# Patient Record
Sex: Female | Born: 1960 | Race: Black or African American | Hispanic: No | Marital: Married | Smoking: Never smoker
Health system: Southern US, Community
[De-identification: ages and names within clinical notes are randomized; demographics above are authoritative.]

## PROBLEM LIST (undated history)

## (undated) DIAGNOSIS — J45909 Unspecified asthma, uncomplicated: Secondary | ICD-10-CM

## (undated) DIAGNOSIS — C801 Malignant (primary) neoplasm, unspecified: Secondary | ICD-10-CM

## (undated) DIAGNOSIS — R112 Nausea with vomiting, unspecified: Secondary | ICD-10-CM

## (undated) DIAGNOSIS — E213 Hyperparathyroidism, unspecified: Secondary | ICD-10-CM

## (undated) DIAGNOSIS — D649 Anemia, unspecified: Secondary | ICD-10-CM

## (undated) DIAGNOSIS — Z9889 Other specified postprocedural states: Secondary | ICD-10-CM

## (undated) DIAGNOSIS — D219 Benign neoplasm of connective and other soft tissue, unspecified: Secondary | ICD-10-CM

## (undated) HISTORY — DX: Malignant (primary) neoplasm, unspecified: C80.1

## (undated) HISTORY — PX: BREAST EXCISIONAL BIOPSY: SUR124

---

## 1999-02-21 ENCOUNTER — Encounter: Admission: RE | Admit: 1999-02-21 | Discharge: 1999-05-22 | Payer: Self-pay | Admitting: Obstetrics and Gynecology

## 1999-05-13 ENCOUNTER — Encounter (INDEPENDENT_AMBULATORY_CARE_PROVIDER_SITE_OTHER): Payer: Self-pay | Admitting: Specialist

## 1999-05-13 ENCOUNTER — Inpatient Hospital Stay (HOSPITAL_COMMUNITY): Admission: AD | Admit: 1999-05-13 | Discharge: 1999-05-16 | Payer: Self-pay | Admitting: Gynecology

## 1999-06-17 HISTORY — PX: TUBAL LIGATION: SHX77

## 1999-06-26 ENCOUNTER — Ambulatory Visit (HOSPITAL_COMMUNITY): Admission: RE | Admit: 1999-06-26 | Discharge: 1999-06-26 | Payer: Self-pay | Admitting: Gynecology

## 2001-02-25 ENCOUNTER — Ambulatory Visit (HOSPITAL_COMMUNITY): Admission: RE | Admit: 2001-02-25 | Discharge: 2001-02-25 | Payer: Self-pay | Admitting: Gynecology

## 2001-02-25 ENCOUNTER — Encounter: Payer: Self-pay | Admitting: Gynecology

## 2001-11-10 ENCOUNTER — Emergency Department (HOSPITAL_COMMUNITY): Admission: EM | Admit: 2001-11-10 | Discharge: 2001-11-10 | Payer: Self-pay | Admitting: Emergency Medicine

## 2001-11-10 ENCOUNTER — Encounter: Payer: Self-pay | Admitting: Emergency Medicine

## 2002-03-10 ENCOUNTER — Ambulatory Visit (HOSPITAL_COMMUNITY): Admission: RE | Admit: 2002-03-10 | Discharge: 2002-03-10 | Payer: Self-pay | Admitting: Gynecology

## 2002-03-10 ENCOUNTER — Encounter: Payer: Self-pay | Admitting: Gynecology

## 2002-10-10 ENCOUNTER — Other Ambulatory Visit: Admission: RE | Admit: 2002-10-10 | Discharge: 2002-10-10 | Payer: Self-pay | Admitting: Gynecology

## 2003-03-16 ENCOUNTER — Encounter: Payer: Self-pay | Admitting: Gynecology

## 2003-03-16 ENCOUNTER — Ambulatory Visit (HOSPITAL_COMMUNITY): Admission: RE | Admit: 2003-03-16 | Discharge: 2003-03-16 | Payer: Self-pay | Admitting: Gynecology

## 2003-09-25 ENCOUNTER — Other Ambulatory Visit: Admission: RE | Admit: 2003-09-25 | Discharge: 2003-09-25 | Payer: Self-pay | Admitting: Gynecology

## 2004-03-21 ENCOUNTER — Ambulatory Visit (HOSPITAL_COMMUNITY): Admission: RE | Admit: 2004-03-21 | Discharge: 2004-03-21 | Payer: Self-pay | Admitting: Gynecology

## 2004-09-25 ENCOUNTER — Other Ambulatory Visit: Admission: RE | Admit: 2004-09-25 | Discharge: 2004-09-25 | Payer: Self-pay | Admitting: Gynecology

## 2005-03-27 ENCOUNTER — Ambulatory Visit (HOSPITAL_COMMUNITY): Admission: RE | Admit: 2005-03-27 | Discharge: 2005-03-27 | Payer: Self-pay | Admitting: Gynecology

## 2006-08-19 ENCOUNTER — Emergency Department (HOSPITAL_COMMUNITY): Admission: EM | Admit: 2006-08-19 | Discharge: 2006-08-19 | Payer: Self-pay | Admitting: Emergency Medicine

## 2008-03-22 ENCOUNTER — Other Ambulatory Visit: Admission: RE | Admit: 2008-03-22 | Discharge: 2008-03-22 | Payer: Self-pay | Admitting: Gynecology

## 2008-03-26 ENCOUNTER — Ambulatory Visit (HOSPITAL_COMMUNITY): Admission: RE | Admit: 2008-03-26 | Discharge: 2008-03-26 | Payer: Self-pay | Admitting: Gynecology

## 2008-11-16 HISTORY — PX: OTHER SURGICAL HISTORY: SHX169

## 2009-02-28 ENCOUNTER — Ambulatory Visit: Payer: Self-pay | Admitting: Gynecology

## 2009-04-05 ENCOUNTER — Ambulatory Visit: Payer: Self-pay | Admitting: Gynecology

## 2009-04-05 ENCOUNTER — Other Ambulatory Visit: Admission: RE | Admit: 2009-04-05 | Discharge: 2009-04-05 | Payer: Self-pay | Admitting: Gynecology

## 2009-04-05 ENCOUNTER — Encounter: Payer: Self-pay | Admitting: Gynecology

## 2009-08-15 ENCOUNTER — Ambulatory Visit (HOSPITAL_COMMUNITY): Admission: RE | Admit: 2009-08-15 | Discharge: 2009-08-15 | Payer: Self-pay | Admitting: Gynecology

## 2009-08-19 ENCOUNTER — Encounter: Admission: RE | Admit: 2009-08-19 | Discharge: 2009-08-19 | Payer: Self-pay | Admitting: Gynecology

## 2011-11-17 HISTORY — PX: OTHER SURGICAL HISTORY: SHX169

## 2012-08-02 ENCOUNTER — Other Ambulatory Visit: Payer: Self-pay | Admitting: *Deleted

## 2012-08-02 DIAGNOSIS — N6320 Unspecified lump in the left breast, unspecified quadrant: Secondary | ICD-10-CM

## 2012-08-12 ENCOUNTER — Ambulatory Visit
Admission: RE | Admit: 2012-08-12 | Discharge: 2012-08-12 | Disposition: A | Discharge status: Home / Self Care | Payer: BC Managed Care – PPO | Source: Ambulatory Visit | Attending: Gynecology | Admitting: Gynecology

## 2012-08-12 ENCOUNTER — Other Ambulatory Visit (HOSPITAL_COMMUNITY)
Admission: RE | Admit: 2012-08-12 | Discharge: 2012-08-12 | Disposition: A | Discharge status: Home / Self Care | Payer: BC Managed Care – PPO | Source: Ambulatory Visit | Attending: Gynecology | Admitting: Gynecology

## 2012-08-12 ENCOUNTER — Other Ambulatory Visit: Payer: Self-pay | Admitting: Gynecology

## 2012-08-12 ENCOUNTER — Telehealth: Payer: Self-pay | Admitting: *Deleted

## 2012-08-12 ENCOUNTER — Ambulatory Visit (INDEPENDENT_AMBULATORY_CARE_PROVIDER_SITE_OTHER): Payer: BC Managed Care – PPO | Admitting: Gynecology

## 2012-08-12 ENCOUNTER — Encounter: Payer: Self-pay | Admitting: Gynecology

## 2012-08-12 VITALS — BP 108/78 | Ht 66.5 in | Wt 276.0 lb

## 2012-08-12 DIAGNOSIS — N6320 Unspecified lump in the left breast, unspecified quadrant: Secondary | ICD-10-CM

## 2012-08-12 DIAGNOSIS — N946 Dysmenorrhea, unspecified: Secondary | ICD-10-CM

## 2012-08-12 DIAGNOSIS — Z01419 Encounter for gynecological examination (general) (routine) without abnormal findings: Secondary | ICD-10-CM

## 2012-08-12 DIAGNOSIS — Z13220 Encounter for screening for lipoid disorders: Secondary | ICD-10-CM

## 2012-08-12 DIAGNOSIS — N920 Excessive and frequent menstruation with regular cycle: Secondary | ICD-10-CM

## 2012-08-12 DIAGNOSIS — E079 Disorder of thyroid, unspecified: Secondary | ICD-10-CM

## 2012-08-12 DIAGNOSIS — Z1151 Encounter for screening for human papillomavirus (HPV): Secondary | ICD-10-CM | POA: Insufficient documentation

## 2012-08-12 LAB — COMPREHENSIVE METABOLIC PANEL
ALT: 20 U/L (ref 0–35)
AST: 18 U/L (ref 0–37)
Alkaline Phosphatase: 101 U/L (ref 39–117)
BUN: 11 mg/dL (ref 6–23)
CO2: 26 mEq/L (ref 19–32)

## 2012-08-12 LAB — CBC WITH DIFFERENTIAL/PLATELET
Basophils Absolute: 0 10*3/uL (ref 0.0–0.1)
Basophils Relative: 1 % (ref 0–1)
Eosinophils Absolute: 0.2 10*3/uL (ref 0.0–0.7)
Hemoglobin: 11 g/dL — ABNORMAL LOW (ref 12.0–15.0)
Lymphs Abs: 2.4 10*3/uL (ref 0.7–4.0)
MCHC: 32 g/dL (ref 30.0–36.0)
Monocytes Absolute: 0.5 10*3/uL (ref 0.1–1.0)
Monocytes Relative: 9 % (ref 3–12)
Platelets: 335 10*3/uL (ref 150–400)
RBC: 4.58 MIL/uL (ref 3.87–5.11)
RDW: 14.8 % (ref 11.5–15.5)
WBC: 6 10*3/uL (ref 4.0–10.5)

## 2012-08-12 LAB — TSH: TSH: 1.432 u[IU]/mL (ref 0.350–4.500)

## 2012-08-12 LAB — FOLLICLE STIMULATING HORMONE: FSH: 6.4 m[IU]/mL

## 2012-08-12 NOTE — Progress Notes (Signed)
Tabitha White 08-10-1961 [id number]        51 y.o.  G3P3003 for annual exam.  Has not been in the office in over 3 years. Several issues noted below.  Past medical history,surgical history, medications, allergies, family history and social history were all reviewed and documented in the EPIC chart. ROS:  Was performed and pertinent positives and negatives are included in the history.  Exam: Fleet Contras assistant Filed Vitals:   08/12/12 1050  BP: 108/78  Height: 5' 6.5" (1.689 m)  Weight: 276 lb (125.193 kg)   General appearance  Normal Skin grossly normal Head/Neck 2 cm firm nontender nodule right lobe of thyroid mobile no overlying skin changes no other palpable abnormalities.  No adenopathy Lungs clear Cardiac RR, without RMG Abdominal  soft, nontender, without masses, organomegaly or hernia Breasts  examined lying and sitting without masses, retractions, discharge or axillary adenopathy. Pelvic  Ext/BUS/vagina  normal   Cervix  normal Pap/HPV  Uterus  Grossly normal, exam limited by abdominal girth.  Adnexa  Without gross masses or tenderness    Anus and perineum  normal   Rectovaginal  normal sphincter tone without palpated masses or tenderness.    Assessment/Plan:  51 y.o. G61P3003 female for annual exam, tubal sterilization.   1. Menorrhagia. Patient notes her periods are becoming heavier and more crampy. She had been evaluated for increasing menorrhagia previously in 2009 with ultrasound showing multiple small myomas. She notes now that her periods are lasting 7 days requiring tampon and pad usage with bleed 2 episodes. They are regular monthly without intermenstrual bleeding. She's not having any significant hormonal symptoms such as hot flushes night sweats.  We'll start with CBC TSH and sonohysterogram. Patient will follow up afterwards for discussion of treatment options.  Will also check an FSH to see where she stands from a menopausal standpoint. 2. Thyroid mass.  Patient has a 2 cm firm mass right thyroid region. Difficult to tell whether thyroid versus muscle. Patient has noticed this over the last several weeks. Nontender to her.  Will start with ultrasound. Discussed various possibilities to include cancer and the need for absolute follow up was stressed. Patient knows will call her to schedule the ultrasound at the hospital. Possible referral to endocrinologist or surgeon reviewed.  3. Mammography. Patient has mammogram scheduled today. We'll continue with annual mammography. SBE monthly reviewed. 4. Pap smear. Pap/HPV done today. Over 3 years since her last Pap smear. She does have numerous normal reports in her chart with no history of abnormal Pap smears before. 5. Health maintenance. Baseline CBC lipid profile comprehensive metabolic panel urinalysis ordered with her other blood work.    Dara Lords MD, 11:53 AM 08/12/2012

## 2012-08-12 NOTE — Patient Instructions (Signed)
Follow up for ultrasound at [facility] office and at the hospital as arranged

## 2012-08-12 NOTE — Telephone Encounter (Signed)
Order placed, ultrasound at [facility] 08/16/12 @ 11:30 am left message for pt to call.

## 2012-08-12 NOTE — Telephone Encounter (Signed)
Message copied by Aura Camps on Fri Aug 12, 2012 12:25 PM ------      Message from: Dara Lords      Created: Fri Aug 12, 2012 12:00 PM       Schedule ultrasound of the thyroid at the hospital. Reference right thyroid palpable mass. She also is arranging for a sonohysterogram at this office. I don't want her confused about the 2 different ultrasound studies as she needs to have both done.

## 2012-08-13 LAB — URINALYSIS W MICROSCOPIC + REFLEX CULTURE
Bilirubin Urine: NEGATIVE
Casts: NONE SEEN
Hgb urine dipstick: NEGATIVE
Leukocytes, UA: NEGATIVE
Nitrite: POSITIVE — AB
Specific Gravity, Urine: 1.018 (ref 1.005–1.030)

## 2012-08-14 LAB — URINE CULTURE

## 2012-08-15 ENCOUNTER — Other Ambulatory Visit: Payer: Self-pay | Admitting: Gynecology

## 2012-08-15 NOTE — Telephone Encounter (Signed)
Patient informed. We reviewed dates for both appts and patient is clear on what is scheduled and when.

## 2012-08-16 ENCOUNTER — Ambulatory Visit (HOSPITAL_COMMUNITY)
Admission: RE | Admit: 2012-08-16 | Discharge: 2012-08-16 | Disposition: A | Discharge status: Home / Self Care | Payer: BC Managed Care – PPO | Source: Ambulatory Visit | Attending: Gynecology | Admitting: Gynecology

## 2012-08-16 ENCOUNTER — Telehealth: Payer: Self-pay | Admitting: Gynecology

## 2012-08-16 DIAGNOSIS — E079 Disorder of thyroid, unspecified: Secondary | ICD-10-CM

## 2012-08-16 NOTE — Telephone Encounter (Signed)
Tell patient ultrasound shows solid area in thyroid where we felt the mass.  Needs to be biopsied.  Check to see where we send patient to arrange ie endocrinologist/radiologist/surgeon and refer her to them.

## 2012-08-17 ENCOUNTER — Other Ambulatory Visit: Payer: Self-pay | Admitting: Gynecology

## 2012-08-17 DIAGNOSIS — N920 Excessive and frequent menstruation with regular cycle: Secondary | ICD-10-CM

## 2012-08-17 NOTE — Telephone Encounter (Signed)
Pt informed with the below note. 

## 2012-08-17 NOTE — Telephone Encounter (Signed)
Left message for pt to call. Appointment with Dr. Derrell Lolling 08/22/12 10:10 a.m. At [facility] [place] [facility].

## 2012-08-18 ENCOUNTER — Other Ambulatory Visit: Payer: Self-pay | Admitting: Gynecology

## 2012-08-18 ENCOUNTER — Ambulatory Visit
Admission: RE | Admit: 2012-08-18 | Discharge: 2012-08-18 | Disposition: A | Discharge status: Home / Self Care | Payer: BC Managed Care – PPO | Source: Ambulatory Visit | Attending: Gynecology | Admitting: Gynecology

## 2012-08-18 DIAGNOSIS — N6320 Unspecified lump in the left breast, unspecified quadrant: Secondary | ICD-10-CM

## 2012-08-19 ENCOUNTER — Ambulatory Visit
Admission: RE | Admit: 2012-08-19 | Discharge: 2012-08-19 | Disposition: A | Discharge status: Home / Self Care | Payer: BC Managed Care – PPO | Source: Ambulatory Visit | Attending: Gynecology | Admitting: Gynecology

## 2012-08-19 DIAGNOSIS — N6320 Unspecified lump in the left breast, unspecified quadrant: Secondary | ICD-10-CM

## 2012-08-22 ENCOUNTER — Ambulatory Visit (INDEPENDENT_AMBULATORY_CARE_PROVIDER_SITE_OTHER): Payer: BC Managed Care – PPO | Admitting: General Surgery

## 2012-08-22 ENCOUNTER — Encounter (INDEPENDENT_AMBULATORY_CARE_PROVIDER_SITE_OTHER): Payer: Self-pay | Admitting: General Surgery

## 2012-08-22 VITALS — BP 110/70 | HR 72 | Temp 97.4°F | Resp 18 | Ht 66.0 in | Wt 279.0 lb

## 2012-08-22 DIAGNOSIS — N63 Unspecified lump in unspecified breast: Secondary | ICD-10-CM

## 2012-08-22 DIAGNOSIS — E041 Nontoxic single thyroid nodule: Secondary | ICD-10-CM

## 2012-08-22 DIAGNOSIS — N6320 Unspecified lump in the left breast, unspecified quadrant: Secondary | ICD-10-CM

## 2012-08-22 NOTE — Progress Notes (Signed)
Patient ID: Tabitha White, female   DOB: 1961/03/05, 51 y.o.   MRN: [id number]  Chief Complaint  Patient presents with  . Thyroid Nodule  . Mass    Breast    HPI Tabitha White is a 51 y.o. female who is referred by Dr. Audie Box for a left breast biopsy which revealed benign breast tissue but  Radiologist would recommend excision of the left lower outer breast tissue which is significant on mammogram and was biopsied.   The patient is biopsy in this area approximately 2010 which time the area was just observed.  The patient also noticed approximately 2-3 weeks ago a right thyroid nodule which was ultrasound however was not FNA.  Patient states she has had no dysphagia pain weight loss change is to heat intolerance at this time but does notice a mass in the right side of her thyroid. HPI  Past Medical History  Diagnosis Date  . Diabetes mellitus     Gestational    Past Surgical History  Procedure Date  . Left breast biospy 2010  . Tubal ligation 06/1999    laproscopic    Family History  Problem Relation Age of Onset  . Heart failure Mother   . Hypertension Mother   . Seizures Mother   . Stroke Mother   . Thyroid disease Mother   . Diabetes Father   . Heart failure Father   . Breast cancer Sister 58  . Diabetes Paternal Grandmother   . Hypertension Sister     Social History History  Substance Use Topics  . Smoking status: Never Smoker   . Smokeless tobacco: Not on file  . Alcohol Use: No    No Known Allergies  No current outpatient prescriptions on file.    Review of Systems Review of Systems  Constitutional: Negative.   HENT: Negative.   Eyes: Negative.   Respiratory: Negative.   Cardiovascular: Negative.   Gastrointestinal: Negative.   Hematological: Negative.     Last menstrual period 07/29/2012.  Physical Exam Physical Exam  Constitutional: She is oriented to person, place, and time. She appears well-developed.  HENT:  Head:  Normocephalic and atraumatic.  Eyes: Conjunctivae normal and EOM are normal. Pupils are equal, round, and reactive to light.  Neck: Normal range of motion. Mass (Right side, not fixed) present.  Cardiovascular: Normal rate and regular rhythm.   Pulmonary/Chest: Effort normal and breath sounds normal.    Abdominal: Soft. Bowel sounds are normal.  Musculoskeletal: Normal range of motion.  Neurological: She is oriented to person, place, and time.    Data Reviewed Ultrasound of the breast and thyroid were reviewed as well as the breast pathology.  Assessment    Patient is a 51 year old female with a left breast mass which was biopsied to reveal to have enlarging however benign mass. Patient is also noticed to have a right-sided thyroid mass which has not been FNA at this point, so will proceed with FNA at this time. Once we get definite pathology from her thyroid mass we will proceed with possible left wire localization excision of her left breast mass with possible thyroid lobectomy to pathology reveals concerning results. If not we'll discontinue with left breast excision    Plan    As above       Marigene Ehlers., Jed Limerick 08/22/2012, 2:43 PM

## 2012-08-24 ENCOUNTER — Encounter: Payer: Self-pay | Admitting: Gynecology

## 2012-08-24 ENCOUNTER — Other Ambulatory Visit: Payer: Self-pay | Admitting: Gynecology

## 2012-08-24 ENCOUNTER — Ambulatory Visit (INDEPENDENT_AMBULATORY_CARE_PROVIDER_SITE_OTHER): Payer: BC Managed Care – PPO | Admitting: Gynecology

## 2012-08-24 ENCOUNTER — Ambulatory Visit
Admission: RE | Admit: 2012-08-24 | Discharge: 2012-08-24 | Disposition: A | Discharge status: Home / Self Care | Payer: BC Managed Care – PPO | Source: Ambulatory Visit | Attending: General Surgery | Admitting: General Surgery

## 2012-08-24 ENCOUNTER — Other Ambulatory Visit (HOSPITAL_COMMUNITY)
Admission: RE | Admit: 2012-08-24 | Discharge: 2012-08-24 | Disposition: A | Discharge status: Home / Self Care | Payer: BC Managed Care – PPO | Source: Ambulatory Visit | Attending: Interventional Radiology | Admitting: Interventional Radiology

## 2012-08-24 ENCOUNTER — Ambulatory Visit (INDEPENDENT_AMBULATORY_CARE_PROVIDER_SITE_OTHER): Payer: BC Managed Care – PPO

## 2012-08-24 DIAGNOSIS — N839 Noninflammatory disorder of ovary, fallopian tube and broad ligament, unspecified: Secondary | ICD-10-CM

## 2012-08-24 DIAGNOSIS — D251 Intramural leiomyoma of uterus: Secondary | ICD-10-CM

## 2012-08-24 DIAGNOSIS — D259 Leiomyoma of uterus, unspecified: Secondary | ICD-10-CM

## 2012-08-24 DIAGNOSIS — N852 Hypertrophy of uterus: Secondary | ICD-10-CM

## 2012-08-24 DIAGNOSIS — E079 Disorder of thyroid, unspecified: Secondary | ICD-10-CM

## 2012-08-24 DIAGNOSIS — N920 Excessive and frequent menstruation with regular cycle: Secondary | ICD-10-CM

## 2012-08-24 DIAGNOSIS — E049 Nontoxic goiter, unspecified: Secondary | ICD-10-CM | POA: Insufficient documentation

## 2012-08-24 DIAGNOSIS — N63 Unspecified lump in unspecified breast: Secondary | ICD-10-CM

## 2012-08-24 NOTE — Progress Notes (Signed)
Patient presents for sonohysterogram due to history of menorrhagia/leiomyoma. Her FSH and TSH returned normal, hemoglobin 11.0. Had needle biopsy of her right thyroid mass this morning. Is also in the process of arranging follow up and excisional biopsy of left breast mass. Biopsy results from this was benign but due to enlarging mass they are proceeding with excision. Her calcium level did return elevated at 12. Not sure if this is related to her thyroid mass as in a parathyroid tumor. We'll check PTH and repeat calcium.  Ultrasound shows uterus enlarged at 16 x 11 x 8 cm. Endometrial echo 10.3 mm. Multiple myomas noted largest the 48 mm. Right ovary with small cystic area 24 x 19 x 22 tubular shaped avascular left ovary normal. Cul-de-sac negative. Sonohysterogram performed, sterile technique, easy catheter introduction, good distention with no abnormalities. Endometrial sample taken. Patient tolerated well.  Assessment and plan: 1. Thyroid mass. Elevated calcium. Will repeat calcium and PTH she will follow up with her surgeon in reference to the mass. 2. Breast mass. I be examined her today she does have a nodular 3 cm area 4 to 6:00 off the areola. It feels consistent with firmer breast tissue. No other masses discharge axillary adenopathy. Patient will proceed with excisional biopsy as they planned. 3. Menorrhagia/leiomyoma.  Ultrasound with multiple myomas. Patient will follow up for biopsy results. Small cystic area which I thinks physiologic on the right ovary possible small proximal hydro-from her tubal. Regardless its avascular simple and small and will plan no specific follow up. Reviewed options for menorrhagia in 51 year old female. Hormonal manipulation, Mirena IUD, endometrial ablation, hysterectomy reviewed. Given her other issues include breast and thyroid will wait at present and readdress this after she is done with her other surgeries. Patient comfortable with this plan and will follow up  after the other surgeries are over.

## 2012-08-24 NOTE — Patient Instructions (Signed)
Office will call you with blood test results and uterine biopsy results.

## 2012-08-25 LAB — PTH, INTACT AND CALCIUM
Calcium, Total (PTH): 12.3 mg/dL — ABNORMAL HIGH (ref 8.4–10.5)
PTH: 228.7 pg/mL — ABNORMAL HIGH (ref 14.0–72.0)

## 2012-08-26 ENCOUNTER — Telehealth (INDEPENDENT_AMBULATORY_CARE_PROVIDER_SITE_OTHER): Payer: Self-pay

## 2012-08-26 NOTE — Telephone Encounter (Signed)
Olegario Messier called to let us know to make sure Dr Derrell Lolling sees the PTH and calcium level.  The patient will be calling our office regarding the results because Dr Derrell Lolling saw the pt for a thyroid nodule.

## 2012-08-31 ENCOUNTER — Other Ambulatory Visit (INDEPENDENT_AMBULATORY_CARE_PROVIDER_SITE_OTHER): Payer: Self-pay | Admitting: General Surgery

## 2012-08-31 DIAGNOSIS — D351 Benign neoplasm of parathyroid gland: Secondary | ICD-10-CM

## 2012-08-31 DIAGNOSIS — E039 Hypothyroidism, unspecified: Secondary | ICD-10-CM

## 2012-09-16 ENCOUNTER — Encounter (HOSPITAL_COMMUNITY)
Admission: RE | Admit: 2012-09-16 | Discharge: 2012-09-16 | Disposition: A | Discharge status: Home / Self Care | Payer: BC Managed Care – PPO | Source: Ambulatory Visit | Attending: General Surgery | Admitting: General Surgery

## 2012-09-16 DIAGNOSIS — D351 Benign neoplasm of parathyroid gland: Secondary | ICD-10-CM

## 2012-09-16 DIAGNOSIS — E213 Hyperparathyroidism, unspecified: Secondary | ICD-10-CM | POA: Insufficient documentation

## 2012-09-16 DIAGNOSIS — R799 Abnormal finding of blood chemistry, unspecified: Secondary | ICD-10-CM | POA: Insufficient documentation

## 2012-09-16 MED ORDER — TECHNETIUM TC 99M SESTAMIBI - CARDIOLITE
24.2000 | Freq: Once | INTRAVENOUS | Status: AC | PRN
  Administered 2012-09-16: 24.2 via INTRAVENOUS

## 2012-09-21 ENCOUNTER — Encounter (INDEPENDENT_AMBULATORY_CARE_PROVIDER_SITE_OTHER): Payer: Self-pay | Admitting: General Surgery

## 2012-09-21 ENCOUNTER — Ambulatory Visit (INDEPENDENT_AMBULATORY_CARE_PROVIDER_SITE_OTHER): Payer: BC Managed Care – PPO | Admitting: General Surgery

## 2012-09-21 VITALS — BP 134/68 | HR 96 | Temp 97.2°F | Resp 20 | Ht 66.5 in | Wt 282.6 lb

## 2012-09-21 DIAGNOSIS — E041 Nontoxic single thyroid nodule: Secondary | ICD-10-CM

## 2012-09-21 NOTE — Progress Notes (Signed)
Patient ID: Tabitha White, female   DOB: 09/10/1961, 51 y.o.   MRN: [id number]  Chief Complaint  Patient presents with  . Results    go over thy test    HPI Tabitha White is a 51 y.o. female.  The patient is a 51-year-old female has been seen in my clinic to see for a benign left breast tissue as well as a thyroid mass. Patient was worked up and found to have primary hyperparathyroidism. Patient had a sestamibi scan which localized to the right lower lobe. Patient had an elevated calcium of 12.3 as well as elevated PTH of 228. Patient states she's had no signs of kidney disease or bony aches at this time however does say she has some variabilityand mood swings   HPI  Past Medical History  Diagnosis Date  . Diabetes mellitus     Gestational    Past Surgical History  Procedure Date  . Left breast biospy 2010  . Tubal ligation 06/1999    laproscopic    Family History  Problem Relation Age of Onset  . Heart failure Mother   . Hypertension Mother   . Seizures Mother   . Stroke Mother   . Thyroid disease Mother   . Diabetes Father   . Heart failure Father   . Breast cancer Sister 54  . Diabetes Paternal Grandmother   . Hypertension Sister     Social History History  Substance Use Topics  . Smoking status: Never Smoker   . Smokeless tobacco: Not on file  . Alcohol Use: No    No Known Allergies  No current outpatient prescriptions on file.    Review of Systems Review of Systems  Constitutional: Negative.   HENT: Negative.   Eyes: Negative.   Respiratory: Negative.   Cardiovascular: Negative.   Gastrointestinal: Negative.   Musculoskeletal: Negative.   Neurological: Negative.     Blood pressure 134/68, pulse 96, temperature 97.2 F (36.2 C), temperature source Temporal, resp. rate 20, height 5' 6.5" (1.689 m), weight 282 lb 9.6 oz (128.187 kg), last menstrual period 08/26/2012.  Physical Exam Physical Exam  Constitutional: She appears  well-developed and well-nourished.  HENT:  Head: Normocephalic and atraumatic.  Eyes: Conjunctivae normal are normal. Pupils are equal, round, and reactive to light.  Neck: Mass (right) present.         Palpable mass  Cardiovascular: Normal rate and regular rhythm.   Pulmonary/Chest: Effort normal and breath sounds normal.  Abdominal: Soft. Bowel sounds are normal.    Data Reviewed Patient PTH of 228. Calcium 12.3. Sestamibi scan localizing parathyroid adenoma the right inferior lobe.  Assessment    1. Patient is 51-year-old female with primary hyperparathyroidism secondary to parathyroid adenoma right inferior location.    Plan    1. We'll proceed operating room for excision of right parathyroid adenoma.  2. After removal of the parathyroid will revisit the excision of her benign breast tissue in her left breast.  2.All risks and benefits were discussed with the patient, to generally include infection, bleeding, damage to surrounding structures. Alternatives were offered and described.  All questions were answered and the patient voiced understanding of the procedure and wishes to proceed at this point.        Montrelle Eddings Jr., Bodhi Stenglein 09/21/2012, 4:10 PM    

## 2012-10-10 ENCOUNTER — Encounter (HOSPITAL_COMMUNITY): Payer: Self-pay | Admitting: Pharmacy Technician

## 2012-10-10 ENCOUNTER — Encounter (HOSPITAL_COMMUNITY): Payer: Self-pay

## 2012-10-10 ENCOUNTER — Encounter (HOSPITAL_COMMUNITY)
Admission: RE | Admit: 2012-10-10 | Discharge: 2012-10-10 | Disposition: A | Discharge status: Home / Self Care | Payer: BC Managed Care – PPO | Source: Ambulatory Visit | Attending: General Surgery | Admitting: General Surgery

## 2012-10-10 DIAGNOSIS — E049 Nontoxic goiter, unspecified: Secondary | ICD-10-CM | POA: Diagnosis not present

## 2012-10-10 DIAGNOSIS — C750 Malignant neoplasm of parathyroid gland: Secondary | ICD-10-CM | POA: Diagnosis not present

## 2012-10-10 DIAGNOSIS — E210 Primary hyperparathyroidism: Secondary | ICD-10-CM | POA: Diagnosis present

## 2012-10-10 DIAGNOSIS — Z79899 Other long term (current) drug therapy: Secondary | ICD-10-CM | POA: Diagnosis not present

## 2012-10-10 DIAGNOSIS — Z01812 Encounter for preprocedural laboratory examination: Secondary | ICD-10-CM | POA: Diagnosis not present

## 2012-10-10 HISTORY — DX: Hyperparathyroidism, unspecified: E21.3

## 2012-10-10 HISTORY — DX: Anemia, unspecified: D64.9

## 2012-10-10 HISTORY — DX: Unspecified asthma, uncomplicated: J45.909

## 2012-10-10 LAB — BASIC METABOLIC PANEL
BUN: 8 mg/dL (ref 6–23)
CO2: 26 mEq/L (ref 19–32)
Chloride: 107 mEq/L (ref 96–112)
GFR calc Af Amer: >90 mL/min (ref >90)
Potassium: 4.7 mEq/L (ref 3.5–5.1)

## 2012-10-10 LAB — HCG, SERUM, QUALITATIVE: Preg, Serum: NEGATIVE

## 2012-10-10 LAB — CBC
Hemoglobin: 10.3 g/dL — ABNORMAL LOW (ref 12.0–15.0)
RBC: 4.43 MIL/uL (ref 3.87–5.11)

## 2012-10-10 LAB — SURGICAL PCR SCREEN
MRSA, PCR: NEGATIVE
Staphylococcus aureus: NEGATIVE

## 2012-10-10 NOTE — Pre-Procedure Instructions (Signed)
20 TAIMI TOWE  10/10/2012   Your procedure is scheduled on:  10/18/2012  Report to [facility] at 5:30 AM.  Call this number if you have problems the morning of surgery: [phone]   Remember:   Do not eat food or drink liquid :After Midnight.- MONDAY  :   Take these medicines the morning of surgery with A SIP OF WATER: NOTHING   Do not wear jewelry, make-up or nail polish.  Do not wear lotions, powders, or perfumes. You may wear deodorant.  Do not shave 48 hours prior to surgery.   Do not bring valuables to the hospital.  Contacts, dentures or bridgework may not be worn into surgery.  Leave suitcase in the car. After surgery it may be brought to your room.  For patients admitted to the hospital, checkout time is 11:00 AM the day of discharge.   Patients discharged the day of surgery will not be allowed to drive home.  Name and phone number of your driver: /w Spouse  Special Instructions: Shower using CHG 2 nights before surgery and the night before surgery.  If you shower the day of surgery use CHG.  Use special wash - you have one bottle of CHG for all showers.  You should use approximately 1/3 of the bottle for each shower.   Please read over the following fact sheets that you were given: Pain Booklet, Coughing and Deep Breathing, MRSA Information and Surgical Site Infection Prevention

## 2012-10-17 MED ORDER — DEXTROSE 5 % IV SOLN
3.0000 g | INTRAVENOUS | Status: AC
  Administered 2012-10-18: 3 g via INTRAVENOUS
  Filled 2012-10-17: qty 3000

## 2012-10-18 ENCOUNTER — Encounter (HOSPITAL_COMMUNITY)
Admission: RE | Disposition: A | Discharge status: Home / Self Care | Payer: Self-pay | Source: Ambulatory Visit | Attending: General Surgery

## 2012-10-18 ENCOUNTER — Encounter (HOSPITAL_COMMUNITY): Payer: Self-pay | Admitting: Surgery

## 2012-10-18 ENCOUNTER — Observation Stay (HOSPITAL_COMMUNITY)
Admission: RE | Admit: 2012-10-18 | Discharge: 2012-10-19 | DRG: 290 | Disposition: A | Discharge status: Home / Self Care | Payer: BC Managed Care – PPO | Source: Ambulatory Visit | Attending: General Surgery | Admitting: General Surgery

## 2012-10-18 ENCOUNTER — Encounter (HOSPITAL_COMMUNITY): Payer: Self-pay | Admitting: Anesthesiology

## 2012-10-18 ENCOUNTER — Ambulatory Visit (HOSPITAL_COMMUNITY): Payer: BC Managed Care – PPO | Admitting: Anesthesiology

## 2012-10-18 DIAGNOSIS — E210 Primary hyperparathyroidism: Secondary | ICD-10-CM | POA: Insufficient documentation

## 2012-10-18 DIAGNOSIS — E049 Nontoxic goiter, unspecified: Secondary | ICD-10-CM | POA: Insufficient documentation

## 2012-10-18 DIAGNOSIS — C750 Malignant neoplasm of parathyroid gland: Principal | ICD-10-CM | POA: Insufficient documentation

## 2012-10-18 DIAGNOSIS — E041 Nontoxic single thyroid nodule: Secondary | ICD-10-CM

## 2012-10-18 DIAGNOSIS — Z01812 Encounter for preprocedural laboratory examination: Secondary | ICD-10-CM | POA: Insufficient documentation

## 2012-10-18 DIAGNOSIS — Z79899 Other long term (current) drug therapy: Secondary | ICD-10-CM | POA: Insufficient documentation

## 2012-10-18 HISTORY — PX: THYROID LOBECTOMY: SHX420

## 2012-10-18 LAB — GLUCOSE, CAPILLARY: Glucose-Capillary: 128 mg/dL — ABNORMAL HIGH (ref 70–99)

## 2012-10-18 LAB — BASIC METABOLIC PANEL
CO2: 26 mEq/L (ref 19–32)
Chloride: 104 mEq/L (ref 96–112)
Glucose, Bld: 131 mg/dL — ABNORMAL HIGH (ref 70–99)
Potassium: 4.9 mEq/L (ref 3.5–5.1)
Sodium: 134 mEq/L — ABNORMAL LOW (ref 135–145)

## 2012-10-18 SURGERY — LOBECTOMY, THYROID
Anesthesia: General | Site: Neck | Laterality: Right | Wound class: Clean

## 2012-10-18 MED ORDER — HEMOSTATIC AGENTS (NO CHARGE) OPTIME
TOPICAL | Status: DC | PRN
  Administered 2012-10-18: 1 via TOPICAL

## 2012-10-18 MED ORDER — ONDANSETRON HCL 4 MG/2ML IJ SOLN
INTRAMUSCULAR | Status: DC | PRN
  Administered 2012-10-18: 4 mg via INTRAVENOUS

## 2012-10-18 MED ORDER — HYDROCODONE-ACETAMINOPHEN 5-325 MG PO TABS
1.0000 | ORAL_TABLET | ORAL | Status: DC | PRN
  Administered 2012-10-18: 2 via ORAL
  Administered 2012-10-18: 1 via ORAL
  Administered 2012-10-19: 2 via ORAL
  Filled 2012-10-18: qty 1
  Filled 2012-10-18 (×2): qty 2

## 2012-10-18 MED ORDER — HYDROMORPHONE HCL PF 1 MG/ML IJ SOLN
0.2500 mg | INTRAMUSCULAR | Status: DC | PRN
  Administered 2012-10-18: 0.5 mg via INTRAVENOUS

## 2012-10-18 MED ORDER — CHLORHEXIDINE GLUCONATE 4 % EX LIQD: 1.0000 "application " | Freq: Once | CUTANEOUS | Status: DC

## 2012-10-18 MED ORDER — ROCURONIUM BROMIDE 100 MG/10ML IV SOLN
INTRAVENOUS | Status: DC | PRN
  Administered 2012-10-18: 50 mg via INTRAVENOUS
  Administered 2012-10-18 (×2): 10 mg via INTRAVENOUS

## 2012-10-18 MED ORDER — BUPIVACAINE HCL (PF) 0.25 % IJ SOLN
INTRAMUSCULAR | Status: AC
  Filled 2012-10-18: qty 30

## 2012-10-18 MED ORDER — LACTATED RINGERS IV SOLN
INTRAVENOUS | Status: DC | PRN
  Administered 2012-10-18 (×2): via INTRAVENOUS

## 2012-10-18 MED ORDER — GLYCOPYRROLATE 0.2 MG/ML IJ SOLN
INTRAMUSCULAR | Status: DC | PRN
  Administered 2012-10-18: 0.6 mg via INTRAVENOUS

## 2012-10-18 MED ORDER — BUPIVACAINE HCL (PF) 0.25 % IJ SOLN
INTRAMUSCULAR | Status: DC | PRN
  Administered 2012-10-18: 10 mL

## 2012-10-18 MED ORDER — ARTIFICIAL TEARS OP OINT
TOPICAL_OINTMENT | OPHTHALMIC | Status: DC | PRN
  Administered 2012-10-18: 1 via OPHTHALMIC

## 2012-10-18 MED ORDER — OXYCODONE HCL 5 MG PO TABS: 5.0000 mg | ORAL_TABLET | Freq: Once | ORAL | Status: DC | PRN

## 2012-10-18 MED ORDER — ONDANSETRON HCL 4 MG/2ML IJ SOLN: 4.0000 mg | Freq: Once | INTRAMUSCULAR | Status: DC | PRN

## 2012-10-18 MED ORDER — ONDANSETRON HCL 4 MG/2ML IJ SOLN: 4.0000 mg | Freq: Four times a day (QID) | INTRAMUSCULAR | Status: DC | PRN

## 2012-10-18 MED ORDER — HYDROMORPHONE HCL PF 1 MG/ML IJ SOLN
INTRAMUSCULAR | Status: AC
  Filled 2012-10-18: qty 1

## 2012-10-18 MED ORDER — DEXTROSE IN LACTATED RINGERS 5 % IV SOLN
INTRAVENOUS | Status: DC
  Administered 2012-10-18: 14:00:00 via INTRAVENOUS
  Administered 2012-10-18: 50 mL/h via INTRAVENOUS

## 2012-10-18 MED ORDER — OXYCODONE HCL 5 MG/5ML PO SOLN: 5.0000 mg | Freq: Once | ORAL | Status: DC | PRN

## 2012-10-18 MED ORDER — MEPERIDINE HCL 25 MG/ML IJ SOLN: 6.2500 mg | INTRAMUSCULAR | Status: DC | PRN

## 2012-10-18 MED ORDER — FENTANYL CITRATE 0.05 MG/ML IJ SOLN
INTRAMUSCULAR | Status: DC | PRN
  Administered 2012-10-18 (×4): 50 ug via INTRAVENOUS
  Administered 2012-10-18: 100 ug via INTRAVENOUS
  Administered 2012-10-18: 50 ug via INTRAVENOUS

## 2012-10-18 MED ORDER — LIDOCAINE HCL (CARDIAC) 20 MG/ML IV SOLN
INTRAVENOUS | Status: DC | PRN
  Administered 2012-10-18: 100 mg via INTRAVENOUS

## 2012-10-18 MED ORDER — PROPOFOL 10 MG/ML IV BOLUS
INTRAVENOUS | Status: DC | PRN
  Administered 2012-10-18: 200 mg via INTRAVENOUS

## 2012-10-18 MED ORDER — NEOSTIGMINE METHYLSULFATE 1 MG/ML IJ SOLN
INTRAMUSCULAR | Status: DC | PRN
  Administered 2012-10-18: 4 mg via INTRAVENOUS

## 2012-10-18 MED ORDER — HYDROMORPHONE HCL PF 1 MG/ML IJ SOLN: 1.0000 mg | INTRAMUSCULAR | Status: DC | PRN

## 2012-10-18 MED ORDER — FLEET ENEMA 7-19 GM/118ML RE ENEM: 1.0000 | ENEMA | Freq: Once | RECTAL | Status: DC

## 2012-10-18 MED ORDER — MIDAZOLAM HCL 5 MG/5ML IJ SOLN
INTRAMUSCULAR | Status: DC | PRN
  Administered 2012-10-18 (×2): 1 mg via INTRAVENOUS

## 2012-10-18 MED ORDER — 0.9 % SODIUM CHLORIDE (POUR BTL) OPTIME
TOPICAL | Status: DC | PRN
  Administered 2012-10-18: 1000 mL

## 2012-10-18 SURGICAL SUPPLY — 58 items
ADH SKN CLS APL DERMABOND .7 (GAUZE/BANDAGES/DRESSINGS) ×2
APL SKNCLS STERI-STRIP NONHPOA (GAUZE/BANDAGES/DRESSINGS) ×2
BENZOIN TINCTURE PRP APPL 2/3 (GAUZE/BANDAGES/DRESSINGS) ×3 IMPLANT
BLADE SURG 15 STRL LF DISP TIS (BLADE) ×2 IMPLANT
BLADE SURG 15 STRL SS (BLADE) ×3
CANISTER SUCTION 2500CC (MISCELLANEOUS) ×3 IMPLANT
CHLORAPREP W/TINT 10.5 ML (MISCELLANEOUS) ×3 IMPLANT
CLIP TI MEDIUM 6 (CLIP) ×5 IMPLANT
CLIP TI WIDE RED SMALL 6 (CLIP) ×5 IMPLANT
CLOTH BEACON ORANGE TIMEOUT ST (SAFETY) ×3 IMPLANT
CONT SPEC 4OZ CLIKSEAL STRL BL (MISCELLANEOUS) ×3 IMPLANT
COVER SURGICAL LIGHT HANDLE (MISCELLANEOUS) ×3 IMPLANT
DERMABOND ADVANCED (GAUZE/BANDAGES/DRESSINGS) ×1
DERMABOND ADVANCED .7 DNX12 (GAUZE/BANDAGES/DRESSINGS) ×1 IMPLANT
DRAPE PED LAPAROTOMY (DRAPES) ×3 IMPLANT
DRAPE UTILITY 15X26 W/TAPE STR (DRAPE) ×6 IMPLANT
ELECT CAUTERY BLADE 6.4 (BLADE) ×3 IMPLANT
ELECT COATED BLADE 2.86 ST (ELECTRODE) ×3 IMPLANT
ELECT REM PT RETURN 9FT ADLT (ELECTROSURGICAL) ×3
ELECTRODE REM PT RTRN 9FT ADLT (ELECTROSURGICAL) ×2 IMPLANT
GAUZE SPONGE 2X2 8PLY STRL LF (GAUZE/BANDAGES/DRESSINGS) ×2 IMPLANT
GAUZE SPONGE 4X4 16PLY XRAY LF (GAUZE/BANDAGES/DRESSINGS) ×7 IMPLANT
GLOVE BIO SURGEON STRL SZ 6.5 (GLOVE) ×2 IMPLANT
GLOVE BIO SURGEON STRL SZ7.5 (GLOVE) ×3 IMPLANT
GLOVE BIOGEL PI IND STRL 7.0 (GLOVE) ×4 IMPLANT
GLOVE BIOGEL PI IND STRL 8 (GLOVE) ×2 IMPLANT
GLOVE BIOGEL PI INDICATOR 7.0 (GLOVE) ×4
GLOVE BIOGEL PI INDICATOR 8 (GLOVE) ×1
GLOVE EUDERMIC 7 POWDERFREE (GLOVE) ×2 IMPLANT
GLOVE EXAM NITRILE MD LF STRL (GLOVE) ×2 IMPLANT
GLOVE SURG SS PI 7.0 STRL IVOR (GLOVE) ×6 IMPLANT
GOWN BRE IMP SLV AUR XL STRL (GOWN DISPOSABLE) ×2 IMPLANT
GOWN STRL NON-REIN LRG LVL3 (GOWN DISPOSABLE) ×10 IMPLANT
HEMOSTAT SURGICEL 2X4 FIBR (HEMOSTASIS) ×3 IMPLANT
KIT BASIN OR (CUSTOM PROCEDURE TRAY) ×3 IMPLANT
KIT ROOM TURNOVER OR (KITS) ×3 IMPLANT
NDL HYPO 25GX1X1/2 BEV (NEEDLE) IMPLANT
NEEDLE HYPO 25GX1X1/2 BEV (NEEDLE) ×3 IMPLANT
NS IRRIG 1000ML POUR BTL (IV SOLUTION) ×3 IMPLANT
PACK SURGICAL SETUP 50X90 (CUSTOM PROCEDURE TRAY) ×3 IMPLANT
PAD ARMBOARD 7.5X6 YLW CONV (MISCELLANEOUS) ×6 IMPLANT
PENCIL BUTTON HOLSTER BLD 10FT (ELECTRODE) ×3 IMPLANT
SHEARS HARMONIC 9CM CVD (BLADE) ×2 IMPLANT
SPONGE GAUZE 2X2 STER 10/PKG (GAUZE/BANDAGES/DRESSINGS) ×1
SPONGE INTESTINAL PEANUT (DISPOSABLE) ×4 IMPLANT
STRIP CLOSURE SKIN 1/2X4 (GAUZE/BANDAGES/DRESSINGS) ×3 IMPLANT
SUT MNCRL AB 4-0 PS2 18 (SUTURE) ×3 IMPLANT
SUT SILK 2 0 (SUTURE)
SUT SILK 2 0 SH (SUTURE) ×2 IMPLANT
SUT SILK 2-0 18XBRD TIE 12 (SUTURE) IMPLANT
SUT SILK 3 0 (SUTURE)
SUT SILK 3-0 18XBRD TIE 12 (SUTURE) IMPLANT
SUT VIC AB 3-0 SH 18 (SUTURE) ×5 IMPLANT
SYR BULB 3OZ (MISCELLANEOUS) ×2 IMPLANT
SYR CONTROL 10ML LL (SYRINGE) ×3 IMPLANT
TOWEL OR 17X24 6PK STRL BLUE (TOWEL DISPOSABLE) ×3 IMPLANT
TOWEL OR 17X26 10 PK STRL BLUE (TOWEL DISPOSABLE) ×3 IMPLANT
TUBE CONNECTING 12X1/4 (SUCTIONS) ×3 IMPLANT

## 2012-10-18 NOTE — Progress Notes (Signed)
Pt reports doing fleets enema at home preoperatively per Dr. Derrell Lolling' staff instruction.  Pt only had gestational diabetes & denies any problems w/blood sugar.

## 2012-10-18 NOTE — Anesthesia Preprocedure Evaluation (Addendum)
Anesthesia Evaluation  Patient identified by MRN, date of birth, ID band Patient awake    Reviewed: Allergy & Precautions, H&P , NPO status , Patient's Chart, lab work & pertinent test results  Airway Mallampati: II TM Distance: >3 FB Neck ROM: Full    Dental  (+) Teeth Intact   Pulmonary asthma ,          Cardiovascular Rhythm:regular     Neuro/Psych    GI/Hepatic   Endo/Other  diabetes, GestationalMorbid obesity  Renal/GU      Musculoskeletal   Abdominal   Peds  Hematology   Anesthesia Other Findings   Reproductive/Obstetrics                         Anesthesia Physical Anesthesia Plan  ASA: II  Anesthesia Plan: General ETT   Post-op Pain Management:    Induction:   Airway Management Planned: Oral ETT  Additional Equipment:   Intra-op Plan:   Post-operative Plan: Extubation in OR  Informed Consent: I have reviewed the patients History and Physical, chart, labs and discussed the procedure including the risks, benefits and alternatives for the proposed anesthesia with the patient or authorized representative who has indicated his/her understanding and acceptance.   Dental Advisory Given  Plan Discussed with: CRNA and Surgeon  Anesthesia Plan Comments:       Anesthesia Quick Evaluation

## 2012-10-18 NOTE — Progress Notes (Signed)
Report given to kay rn as caregiver 

## 2012-10-18 NOTE — Transfer of Care (Signed)
Immediate Anesthesia Transfer of Care Note  Patient: Tabitha White  Procedure(s) Performed: Procedure(s) (LRB) with comments: THYROID LOBECTOMY (Right) - Right thyroid lobectomy with right parathyroid biopsy  Patient Location: PACU  Anesthesia Type:General  Level of Consciousness: awake  Airway & Oxygen Therapy: Patient Spontanous Breathing and Patient connected to face mask oxygen  Post-op Assessment: Report given to PACU RN and Post -op Vital signs reviewed and stable  Post vital signs: Reviewed and stable  Complications: No apparent anesthesia complications

## 2012-10-18 NOTE — Preoperative (Signed)
Beta Blockers   Reason not to administer Beta Blockers:Not Applicable 

## 2012-10-18 NOTE — Interval H&P Note (Signed)
History and Physical Interval Note:  10/18/2012 7:15 AM  Tabitha White  has presented today for surgery, with the diagnosis of hyperparathyroidism  The various methods of treatment have been discussed with the patient and family. After consideration of risks, benefits and other options for treatment, the patient has consented to  Procedure(s) (LRB) with comments: PARATHYROIDECTOMY (N/A) - Excision right inferior parathyroid(Parathyroidectomy) as a surgical intervention .  The patient's history has been reviewed, patient examined, no change in status, stable for surgery.  I have reviewed the patient's chart and labs.  Questions were answered to the patient's satisfaction.     Marigene Ehlers., Jed Limerick

## 2012-10-18 NOTE — H&P (View-Only) (Signed)
Patient ID: Tabitha White, female   DOB: 1961-11-02, 51 y.o.   MRN: [id number]  Chief Complaint  Patient presents with  . Results    go over thy test    HPI Tabitha White is a 51 y.o. female.  The patient is a 51 year old female has been seen in my clinic to see for a benign left breast tissue as well as a thyroid mass. Patient was worked up and found to have primary hyperparathyroidism. Patient had a sestamibi scan which localized to the right lower lobe. Patient had an elevated calcium of 12.3 as well as elevated PTH of 228. Patient states she's had no signs of kidney disease or bony aches at this time however does say she has some variabilityand mood swings   HPI  Past Medical History  Diagnosis Date  . Diabetes mellitus     Gestational    Past Surgical History  Procedure Date  . Left breast biospy 2010  . Tubal ligation 06/1999    laproscopic    Family History  Problem Relation Age of Onset  . Heart failure Mother   . Hypertension Mother   . Seizures Mother   . Stroke Mother   . Thyroid disease Mother   . Diabetes Father   . Heart failure Father   . Breast cancer Sister 35  . Diabetes Paternal Grandmother   . Hypertension Sister     Social History History  Substance Use Topics  . Smoking status: Never Smoker   . Smokeless tobacco: Not on file  . Alcohol Use: No    No Known Allergies  No current outpatient prescriptions on file.    Review of Systems Review of Systems  Constitutional: Negative.   HENT: Negative.   Eyes: Negative.   Respiratory: Negative.   Cardiovascular: Negative.   Gastrointestinal: Negative.   Musculoskeletal: Negative.   Neurological: Negative.     Blood pressure 134/68, pulse 96, temperature 97.2 F (36.2 C), temperature source Temporal, resp. rate 20, height 5' 6.5" (1.689 m), weight 282 lb 9.6 oz (128.187 kg), last menstrual period 08/26/2012.  Physical Exam Physical Exam  Constitutional: She appears  well-developed and well-nourished.  HENT:  Head: Normocephalic and atraumatic.  Eyes: Conjunctivae normal are normal. Pupils are equal, round, and reactive to light.  Neck: Mass (right) present.         Palpable mass  Cardiovascular: Normal rate and regular rhythm.   Pulmonary/Chest: Effort normal and breath sounds normal.  Abdominal: Soft. Bowel sounds are normal.    Data Reviewed Patient PTH of 228. Calcium 12.3. Sestamibi scan localizing parathyroid adenoma the right inferior lobe.  Assessment    1. Patient is 51 year old female with primary hyperparathyroidism secondary to parathyroid adenoma right inferior location.    Plan    1. We'll proceed operating room for excision of right parathyroid adenoma.  2. After removal of the parathyroid will revisit the excision of her benign breast tissue in her left breast.  2.All risks and benefits were discussed with the patient, to generally include infection, bleeding, damage to surrounding structures. Alternatives were offered and described.  All questions were answered and the patient voiced understanding of the procedure and wishes to proceed at this point.        Tabitha White., Tabitha White 09/21/2012, 4:10 PM

## 2012-10-18 NOTE — Anesthesia Postprocedure Evaluation (Signed)
Anesthesia Post Note  Patient: Tabitha White  Procedure(s) Performed: Procedure(s) (LRB): THYROID LOBECTOMY (Right)  Anesthesia type: general  Patient location: PACU  Post pain: Pain level controlled  Post assessment: Patient's Cardiovascular Status Stable  Last Vitals:  Filed Vitals:   10/18/12 1115  BP: 125/63  Pulse: 94  Temp: 36.9 C  Resp: 23    Post vital signs: Reviewed and stable  Level of consciousness: sedated  Complications: No apparent anesthesia complications

## 2012-10-18 NOTE — Op Note (Signed)
Pre Operative Diagnosis:  Primary hyperparathyroidism  Post Operative Diagnosis: primary hyperparathyroidism, and right cystic lobe of thyroid  Procedure: right thyroid lobectomy, superior parathyroid biopsy  Surgeon: Dr. Axel Filler  Assistant: Dr. Derrell Lolling  Anesthesia: GETA  EBL: 20 cc  Complications: none  Counts: reported as correct x 2  Findings:  The patient is a cystic right thyroid lobe. The superior parathyroid gland was identified for biopsy and as per pathology. The inferior parathyroid adenoma was not found in the usual anatomical location, it could not be palpated in the tracheoesophageal groove , or carotid sheath.  Indications for procedure:  The patient is a 51 year old female who was initially worked up for a right thyroid mass. This was biopsied which does show thyroiditis. Patient ultimately had elevated calcium and PTH. Patient localizes sestamibi scan to the right inferior parathyroid adenoma. Secondary to the localization proceeded to attempt to remove the right inferior parathyroid for primary parathyroidism  Details of the procedure:The patient was taken back to the operating room. The patient was placed in supine position with bilateral SCDs in place. After appropriate anitbiotics were confirmed, a time-out was confirmed and all facts were verified.  a collar incision was made approximately 4 cm, this was approximately 2 fingerbreadths above the sternal notch.  Bovie Cautery  Was used to maintain hemostasis and dissection is carried down through the platysma. Platysmal flaps were raised superiorly and inferiorly to the thyroid notch as well as a sternal notch.  The strap muscles were divided in the midline. We then proceeded to raise her right lobe of the thyroid into the incision site.  Initially mobilize the right inferior lobe in hopes of identifying the right inferior parathyroid adenoma. However secondary to cystic nature and mass of the right thyroid lobe. We  continued to mobilize the superior thyroid lobe. The superior thyroid vessels were doubly ligated. The superior parathyroid gland was identified and biopsies taken and sent for frozen pathology and confirmed.  This allowed [place] to continue mobilize the inferior lobe.  The inferior vessels were doubly ligated. Upon bringing the lobe into the midline were not able to identify the inferior parathyroid adenoma.  Unable to palpate the tracheoesophageal groove as well as the carotid sheath were unable to palpate a nodular adenoma.  At this time secondary to the fact of not being able to find the inferior parathyroid adenoma sent to abandon the search for it.  Pathology attempted to dissect the thyroid gland were unable to identify any parathyroid adenoma.  After the extent of our dissection dissecting the hemostasis was excellent despite the case. Fibrillar was used to the strap muscles and they treated for hemostasis. The strap muscles were then realigned to the midline using interrupted 3-0 Vicryl stitches. The platysma was then reapproximated using 3-0 Vicryl stitches in interrupted fashion. The skin was reapproximated using 4 Monocryl in a subcuticular fashion. The skin was then dressed with Dermabond.  The patient was taken to the recovery room in stable condition.

## 2012-10-19 ENCOUNTER — Encounter (HOSPITAL_COMMUNITY): Payer: Self-pay | Admitting: General Surgery

## 2012-10-19 ENCOUNTER — Other Ambulatory Visit (INDEPENDENT_AMBULATORY_CARE_PROVIDER_SITE_OTHER): Payer: Self-pay | Admitting: General Surgery

## 2012-10-19 DIAGNOSIS — C750 Malignant neoplasm of parathyroid gland: Secondary | ICD-10-CM | POA: Diagnosis not present

## 2012-10-19 DIAGNOSIS — D351 Benign neoplasm of parathyroid gland: Secondary | ICD-10-CM

## 2012-10-19 LAB — BASIC METABOLIC PANEL
CO2: 25 mEq/L (ref 19–32)
Chloride: 104 mEq/L (ref 96–112)
GFR calc Af Amer: 70 mL/min — ABNORMAL LOW (ref >90)
Sodium: 136 mEq/L (ref 135–145)

## 2012-10-19 LAB — CALCIUM, IONIZED: Calcium, Ion: 1.46 mmol/L — ABNORMAL HIGH (ref 1.12–1.32)

## 2012-10-19 MED ORDER — OXYCODONE-ACETAMINOPHEN 10-325 MG PO TABS: 1.0000 | ORAL_TABLET | ORAL | Status: DC | PRN

## 2012-10-19 NOTE — Progress Notes (Signed)
Patient discharged to home.  Discharge teaching complete including follow up care, medications, signs and symptoms of infection.  Verbalizes understanding with no further questions.  Discharged per wheelchair with family.

## 2012-10-19 NOTE — Discharge Summary (Signed)
Physician Discharge Summary  Patient ID: Tabitha White MRN: [id number] DOB/AGE: 1961/10/30 51 y.o.  Admit date: 10/18/2012 Discharge date: 10/19/2012  Admission Diagnoses: Primary hypoparathyroidism  Discharge Diagnoses:  S/p R thyroidectomy Active Problems:  * No active hospital problems. *    Discharged Condition: good  Hospital Course: Pt was sent to the floor post op.  Pt was started on a CLD and adv to a reg diet which she was able to tol well.  She had good pain control.  Ca con't to decrease from labs in the hospital.  [facility] for DC home  Consults: None  Significant Diagnostic Studies: labs: Ca:10  Treatments: surgery: R thyroidectomy  Discharge Exam: Blood pressure 111/57, pulse 79, temperature 97.9 F (36.6 C), temperature source Oral, resp. rate 18, height 5\' 6"  (1.676 m), weight 280 lb (127.007 kg), last menstrual period 09/26/2012, SpO2 95.00%. Neck: wound is c/d/i, minimal edema  Disposition:   Discharge Orders    Future Appointments: Provider: Department: Dept Phone: Center:   11/04/2012 4:00 PM Axel Filler, MD [facility], [place] [phone] None       Medication List     As of 10/19/2012  7:28 AM    ASK your doctor about these medications         acetaminophen 500 MG tablet   Commonly known as: TYLENOL   Take 1,000 mg by mouth every 6 (six) hours as needed. For pain/fever           Follow-up Information    Follow up with Lajean Saver, MD. Schedule an appointment as soon as possible for a visit in 1 week.   Contact information:   [address] [place] [address] [phone]          Signed: Marigene Ehlers., Jed Limerick 10/19/2012, 7:28 AM

## 2012-10-21 ENCOUNTER — Telehealth (INDEPENDENT_AMBULATORY_CARE_PROVIDER_SITE_OTHER): Payer: Self-pay | Admitting: General Surgery

## 2012-10-21 NOTE — Telephone Encounter (Signed)
Pt called with question about swelling/ puffiness at incision.  She states she can feel fluid when she touches the area.  Denies pain, fever, redness or other signs of infection.  No problems with swallowing.  Reassured pt and okayed her use of ice pack to site as needed.  She understands.

## 2012-10-25 ENCOUNTER — Encounter: Payer: Self-pay | Admitting: *Deleted

## 2012-10-25 NOTE — Progress Notes (Signed)
Referral from Dr. Derrell Lolling with [facility] to see Dr. Truett Perna for new DX: parathyroid carcinoma. Patient has not been give diagnosis yet. Sees surgeon on 10/27/12. Per Dr. Truett Perna, he will see her on 12/26 at 3:15 pm (1 hr) or 12/24 1:30 pm (1 hr) as New Patient. DO NOT CALL PATIENT UNTIL AFTER 10/31/12. Forwarded to [employer] in [employer]

## 2012-10-26 ENCOUNTER — Telehealth: Payer: Self-pay | Admitting: Oncology

## 2012-10-26 LAB — PTH, INTACT AND CALCIUM
Calcium, Total (PTH): 7.5 mg/dL — ABNORMAL LOW (ref 8.4–10.5)
PTH: 139.6 pg/mL — ABNORMAL HIGH (ref 14.0–72.0)

## 2012-10-26 LAB — CALCIUM: Calcium: 7.4 mg/dL — ABNORMAL LOW (ref 8.4–10.5)

## 2012-10-26 NOTE — Telephone Encounter (Signed)
LVOM for pt to return call.  °

## 2012-10-27 ENCOUNTER — Ambulatory Visit (INDEPENDENT_AMBULATORY_CARE_PROVIDER_SITE_OTHER): Payer: BC Managed Care – PPO | Admitting: General Surgery

## 2012-10-27 ENCOUNTER — Other Ambulatory Visit (INDEPENDENT_AMBULATORY_CARE_PROVIDER_SITE_OTHER): Payer: Self-pay | Admitting: General Surgery

## 2012-10-27 ENCOUNTER — Encounter (INDEPENDENT_AMBULATORY_CARE_PROVIDER_SITE_OTHER): Payer: Self-pay | Admitting: General Surgery

## 2012-10-27 ENCOUNTER — Telehealth: Payer: Self-pay | Admitting: Oncology

## 2012-10-27 VITALS — BP 128/70 | HR 72 | Temp 97.8°F | Resp 16 | Ht 66.5 in | Wt 286.0 lb

## 2012-10-27 DIAGNOSIS — Z9889 Other specified postprocedural states: Secondary | ICD-10-CM

## 2012-10-27 DIAGNOSIS — C750 Malignant neoplasm of parathyroid gland: Secondary | ICD-10-CM

## 2012-10-27 MED ORDER — CALCIUM CARBONATE-VITAMIN D 500-200 MG-UNIT PO TABS: 2.0000 | ORAL_TABLET | Freq: Three times a day (TID) | ORAL | Status: DC

## 2012-10-27 NOTE — Progress Notes (Signed)
Patient ID: Tabitha White, female   DOB: 1961-04-14, 51 y.o.   MRN: [id number] The patient is a 51 year old female status post right-sided thyroidectomy for hyperparathyroidism. In the operating room were unable to localize the parathyroid gland therefore secondary to a large cystic-appearing right thyroid gland this was removed.  The final pathology patient was seen to have a parathyroid carcinoma. Patient's laboratory studies postoperatively revealed a normal calcium as well as decreasing PTH level. Patient has been doing well postoperatively swollen sentinel points of bone pain or changes in her temperament.  On exam: Wounds clean dry and intact minimal edema to the area  Assessment and plan:  1. I discussed with the patient in regards to her pathology and rarity of the pathology. We discussed the need to have patient follow endocrinologist and hematology oncologist. Possible the patient may benefit from radiation therapy as well as the neckissection. If the patient does need a neck dissection I would prefer followup with an ENT physician. I spoke with Dr. Myrle Sheng and Dr. Sharl Ma in regard to pathology and they both agreed to evaluate the patient.  2. The patient will followup in one month.  3. The patient will be prescribed calcium supplementation at this time.

## 2012-10-27 NOTE — Telephone Encounter (Signed)
C/D 10/27/12 for appt.11/08/12

## 2012-10-28 ENCOUNTER — Telehealth (INDEPENDENT_AMBULATORY_CARE_PROVIDER_SITE_OTHER): Payer: Self-pay | Admitting: General Surgery

## 2012-10-28 NOTE — Telephone Encounter (Signed)
Called patient and told her that Dr Sharl Ma office will call her next about a apt . I faxed all notes to Dr Sharl Ma office

## 2012-11-04 ENCOUNTER — Encounter (INDEPENDENT_AMBULATORY_CARE_PROVIDER_SITE_OTHER): Payer: BC Managed Care – PPO | Admitting: General Surgery

## 2012-11-08 ENCOUNTER — Ambulatory Visit (HOSPITAL_COMMUNITY)
Admission: RE | Admit: 2012-11-08 | Discharge: 2012-11-08 | Disposition: A | Discharge status: Home / Self Care | Payer: BC Managed Care – PPO | Source: Ambulatory Visit | Attending: Oncology | Admitting: Oncology

## 2012-11-08 ENCOUNTER — Encounter: Payer: Self-pay | Admitting: Oncology

## 2012-11-08 ENCOUNTER — Telehealth: Payer: Self-pay | Admitting: Oncology

## 2012-11-08 ENCOUNTER — Ambulatory Visit: Payer: BC Managed Care – PPO

## 2012-11-08 ENCOUNTER — Ambulatory Visit (HOSPITAL_BASED_OUTPATIENT_CLINIC_OR_DEPARTMENT_OTHER): Payer: BC Managed Care – PPO | Admitting: Oncology

## 2012-11-08 VITALS — BP 129/81 | HR 67 | Temp 97.5°F | Resp 18 | Ht 66.5 in | Wt 282.3 lb

## 2012-11-08 DIAGNOSIS — C750 Malignant neoplasm of parathyroid gland: Secondary | ICD-10-CM | POA: Insufficient documentation

## 2012-11-08 DIAGNOSIS — D509 Iron deficiency anemia, unspecified: Secondary | ICD-10-CM

## 2012-11-08 DIAGNOSIS — N63 Unspecified lump in unspecified breast: Secondary | ICD-10-CM

## 2012-11-08 NOTE — Progress Notes (Signed)
Checked in new pt with no financial concerns. °

## 2012-11-08 NOTE — Telephone Encounter (Signed)
gv and printed appt schedule for pt for March and April 2014...Marland KitchenMarland Kitchenlvm for radiation to schedule appt...the patient going to radiology today.Marland KitchenMarland Kitchen

## 2012-11-08 NOTE — Progress Notes (Signed)
[facility] New Patient Consult   Referring MD: Brylan Seubert 51 y.o.  [birth year]    Reason for Referral: Parathyroid carcinoma     HPI: She noted a mass in the right neck and saw Dr. Audie Box. A thyroid ultrasound on 08/16/2012 confirmed a dominant 3.8 cm right thyroid nodule that was solid. She underwent an FNA biopsy of the right thyroid on 08/24/2012. The cytology revealed evidence of thyroiditis. She was noted to have an elevated calcium level and parathyroid hormone level. A parathyroid scan revealed a persistent focus of increased activity in the inferior right lobe of thyroid consistent with a parathyroid adenoma.  She also has a breast lesion that needs excision. A decision was made to proceed with excision of the left breast mass at a later date.  She was taken to the operating room on 10/18/2012 and underwent a right thyroid lobectomy and superior parathyroid biopsy. An inferior parathyroid adenoma was not found. The pathology revealed a superior right parathyroid gland with no evidence of malignancy. The right thyroid lobectomy contained a 5 cm parathyroid carcinoma, low site of nuclear grade with multiple foci of vascular invasion. Tumor was noted to be adherent to the did not invade the adjacent thyroid parenchyma. The margins were free of carcinoma with a tumor embolus at less than 01 point centimeters from the margin. The case was sent for outside consultation at the [facility] and a diagnosis of parathyroid carcinoma was confirmed.  The calcium level is lower following surgery. She saw Dr. Sharl Ma and has been placed on calcium and vitamin D. No specific complaint today.   Past Medical History  Diagnosis Date  .  parathyroid carcinoma   10/18/2012   . Asthma     as a child, but never used inhaler   . Anemia     per pt. report  . Diabetes mellitus     Gestational, 2000   .  Hypercalcemia and elevated parathyroid  hormone level                                  December 2013  .  Left breast mass, 6:00 position-no atypia or malignancy identified on a core biopsy 08/18/2012  .  G3 P3, she continues to have regular monthly menses  Past Surgical History  Procedure Date  . Left breast biospy 2010  . Tubal ligation 06/1999    laproscopic      . Thyroid lobectomy 10/18/2012    Procedure: THYROID LOBECTOMY;  Surgeon: Axel Filler, MD;  Location: [facility] OR;  Service: General;  Laterality: Right;  Right thyroid lobectomy with right parathyroid biopsy    Family History  Problem Relation Age of Onset  . Heart failure Mother   . Hypertension Mother   . Seizures Mother   . Stroke Mother   . Thyroid disease Mother   . Diabetes Father   . Heart failure Father   . Breast cancer Sister 4  . Diabetes Paternal Grandmother   . Hypertension Sister    .   Breast cancer  maternal aunt  .   "Stomach cancer "                                                                      maternal uncle  Current outpatient prescriptions:calcium-vitamin D (OSCAL 500/200 D-3) 500-200 MG-UNIT per tablet, Take 2 tablets by mouth 3 (three) times daily., Disp: 180 tablet, Rfl: 3;  oxyCODONE-acetaminophen (PERCOCET) 10-325 MG per tablet, Take 10 mg by mouth as needed., Disp: , Rfl: ;  Vitamin D, Ergocalciferol, (DRISDOL) 50000 UNITS CAPS, Take 1.25 mg by mouth Daily., Disp: , Rfl:   Allergies: No Known Allergies  Social History: She works an Paramedic occupation. She lives with her husband and [place]. She does not use tobacco or alcohol. No transfusion history. No risk factor for HIV or hepatitis.   ROS:   Positives include: Palpable mass in the right neck beginning in June of 2013  A complete ROS was otherwise negative.  Physical Exam:  Blood pressure 129/81, pulse 67, temperature 97.5 F (36.4 C), temperature source Oral, resp. rate 18, height 5' 6.5"  (1.689 m), weight 282 lb 4.8 oz (128.05 kg), last menstrual period 10/22/2012.  HEENT: Oral cavity without visible mass, cannot visualize the pharynx, neck without mass, healing incision at the low neck Lungs: Clear bilaterally Cardiac: Regular rate and rhythm Abdomen: No hepatosplenomegaly, nontender, no mas  Vascular: Trace edema at the low pretibial area bilaterally Lymph nodes: No cervical, supraclavicular, axillary, or inguinal nodes Neurologic: Alert and oriented, the motor appears intact in the upper and lower extremities. The deep tendon reflexes are 2+ and symmetric at the biceps and 1+ at the knees Skin: Multiple benign appearing moles over the trunk Musculoskeletal: No spine tenderness   LAB:  CBC  Lab Results  Component Value Date   WBC 6.5 10/10/2012   HGB 10.3* 10/10/2012   HCT 33.7* 10/10/2012   MCV 76.1* 10/10/2012   PLT 282 10/10/2012     CMP      Component Value Date/Time   NA 136 10/19/2012 0550   K 3.9 10/19/2012 0550   CL 104 10/19/2012 0550   CO2 25 10/19/2012 0550   GLUCOSE 109* 10/19/2012 0550   BUN 14 10/19/2012 0550   CREATININE 1.06 10/19/2012 0550   CREATININE 0.70 08/12/2012 1144   CALCIUM 7.4* 10/25/2012 1315   CALCIUM 7.5* 10/19/2012 0844   PROT 6.9 08/12/2012 1144   ALBUMIN 4.2 08/12/2012 1144   AST 18 08/12/2012 1144   ALT 20 08/12/2012 1144   ALKPHOS 101 08/12/2012 1144   BILITOT 0.4 08/12/2012 1144   GFRNONAA 60* 10/19/2012 0550   GFRAA 70* 10/19/2012 0550    Assessment/Plan:   1. Parathyroid carcinoma, status post a right thyroidectomy on 10/18/2012 confirming a 5 cm parathyroid carcinoma with multiple foci of vascular invasion  2. History of hypercalcemia secondary to #1-resolved following surgical resection of the parathyroid carcinoma  3. Microcytic anemia,? Iron deficiency related to menses  4. Left breast mass-status post a core biopsy 08/18/2012 with no evidence of malignancy, scheduled to undergo an excisional biopsy by Dr.  Derrell Lolling   Disposition:   She has been diagnosed with parathyroid carcinoma. This is an uncommon malignancy and there is no clear indication for additional staging x-rays or adjuvant  therapy. She has been placed on calcium and will be followed by Dr.Kerr. I will refer her to radiation oncology to consider the indication for adjuvant radiation. There is no role for systemic chemotherapy in this setting.  There is no family history to suggest a familial cancer syndrome. However she will be referred to the genetics counselor as some patients with sporadic parathyroid carcinoma have a germline HRPT2 mutation.  She will be referred for a staging chest x-ray. Ms. Bedoya will return for an office visit in 4 months.  The microcytic anemia is most likely related to menstrual blood loss and iron deficiency. We will ask Dr. Audie Box whether this has been a chronic finding. If not, an evaluation for a source of blood loss may be indicated.  Tabitha White 11/08/2012, 5:01 PM

## 2012-11-10 ENCOUNTER — Telehealth: Payer: Self-pay

## 2012-11-10 ENCOUNTER — Encounter (INDEPENDENT_AMBULATORY_CARE_PROVIDER_SITE_OTHER): Payer: Self-pay

## 2012-11-10 ENCOUNTER — Telehealth: Payer: Self-pay | Admitting: Oncology

## 2012-11-10 NOTE — Telephone Encounter (Signed)
Left a message in work vm stating CXR was negative as noted below.

## 2012-11-10 NOTE — Telephone Encounter (Signed)
Message copied by Lorine Bears on Thu Nov 10, 2012  2:56 PM ------      Message from: Wandalee Ferdinand      Created: Thu Nov 10, 2012  2:33 PM                   ----- Message -----         From: Ladene Artist, MD         Sent: 11/09/2012   8:51 AM           To: Wandalee Ferdinand, RN, Glori Luis, RN, #            Please call patient, cxr is negative

## 2012-11-10 NOTE — Telephone Encounter (Signed)
Mrs. Tabitha White returned msg and scheduled pt for 1.9.14 @ 10:15am with Dr. Michell Heinrich...the patient is aware

## 2012-11-15 ENCOUNTER — Ambulatory Visit (HOSPITAL_COMMUNITY): Payer: BC Managed Care – PPO

## 2012-11-15 ENCOUNTER — Telehealth: Payer: Self-pay | Admitting: *Deleted

## 2012-11-15 NOTE — Telephone Encounter (Signed)
New patient consult per Dr. Truett Perna on 11/08/12. Requests to determine from Dr. Audie Box: Is her microcytic anemia chronic and does she have a history of iron deficiency in the past?

## 2012-11-23 ENCOUNTER — Encounter: Payer: Self-pay | Admitting: Radiation Oncology

## 2012-11-23 DIAGNOSIS — C750 Malignant neoplasm of parathyroid gland: Secondary | ICD-10-CM

## 2012-11-23 NOTE — Progress Notes (Signed)
52 year old female. Married.   Palpated mass in right neck beginning June of 2013. Diagnosed with Parathyroid carcinoma status post a right thyroidectomy on 10/18/12 with multiple foci of vascular invasion. Referred by Dr. Alcide Evener for consideration of adjuvant radiation. Dr. Myrle Sheng reports there is no role for systemic chemotherapy in this setting.   Also, benign left breast mass excision delayed.   NKDA No hx of radiation therapy No indication of a pacemaker

## 2012-11-24 ENCOUNTER — Encounter: Payer: Self-pay | Admitting: Radiation Oncology

## 2012-11-24 ENCOUNTER — Telehealth: Payer: Self-pay | Admitting: Radiation Oncology

## 2012-11-24 ENCOUNTER — Ambulatory Visit
Admission: RE | Admit: 2012-11-24 | Discharge: 2012-11-24 | Disposition: A | Discharge status: Home / Self Care | Payer: BC Managed Care – PPO | Source: Ambulatory Visit | Attending: Radiation Oncology | Admitting: Radiation Oncology

## 2012-11-24 VITALS — BP 150/92 | HR 71 | Temp 98.1°F | Ht 66.5 in | Wt 281.0 lb

## 2012-11-24 DIAGNOSIS — C750 Malignant neoplasm of parathyroid gland: Secondary | ICD-10-CM | POA: Insufficient documentation

## 2012-11-24 NOTE — Progress Notes (Signed)
[place] Assesment today

## 2012-11-24 NOTE — Telephone Encounter (Signed)
Met w patient to discuss RO billing. Pt had some financial concerns and advised she will call if she needs assistance.  Dx:  Parathyroid carcinoma - Primary 194.1   Attending Rad:  SW   Rad Tx: Pt has elected no rad tx at this time.

## 2012-11-24 NOTE — Progress Notes (Signed)
Consultation today for Parathyroid Cancer.  Denies any pain or difficulty swallowing nor change in appetite. Requesting review of pathology report.

## 2012-11-24 NOTE — Progress Notes (Signed)
Radiation Oncology         [phone] ________________________________  Initial outpatient Consultation  Name: Tabitha White MRN: [id number]  Date: 11/24/2012  DOB: [birth year]  REFERRING PHYSICIAN: Ladene Artist, MD  DIAGNOSIS: The encounter diagnosis was Parathyroid carcinoma.  HISTORY OF PRESENT ILLNESS::Tabitha White is a 52 y.o. female  palpated a right neck mass in June of this year. She eventually was diagnosed with a parathyroid carcinoma. She had elevated PTH and calcium levels. She underwent a right thyroidectomy on 10/18/2012. A 5 cm parathyroid carcinoma was discovered. This was of low side and nuclear grade with multiple foci of vascular invasion. Tumor was adherent but did not invade into the adjacent thyroid parenchyma. Margins were close. In discussion with pathology the suspicion is that the anterior and posterior margins are less than a millimeter. There was tumor embolus NA vascular space less than 0.1 cm and margin. The pathology was reviewed at the [facility] and diagnosis was confirmed. In speaking with her surgeon he states the thyroid was removed easily. He did not have to scrape anything off of the thyroid cartilage or from the skin. The patient was seen by medical oncology who did not feel there was a role for chemotherapy. She has been referred to genetics counseling. He has no side effects or swallowing difficulties after surgery. She has no pain. Her only surgery related complaint is some swelling along the incision of her neck. Incidentally she has also recently been worked up for a left breast mass. 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 This is concordant with the imaging findings however as the mass was increasing significantly in size surgical excision has been recommended. This has not been pursued at this time secondary to her parathyroid carcinoma. She has followup  scheduled with Dr. Derrell Lolling to discuss this.  PREVIOUS RADIATION THERAPY: No  PAST MEDICAL HISTORY:  has a past medical history of Hyperparathyroidism; Asthma; Anemia; Diabetes mellitus; and Cancer.    PAST SURGICAL HISTORY: Past Surgical History  Procedure Date  . Left breast biospy 2010    benign  . Tubal ligation 06/1999    laproscopic  . Thyroid lobectomy 10/18/2012    Procedure: THYROID LOBECTOMY;  Surgeon: Axel Filler, MD;  Location: [facility] OR;  Service: General;  Laterality: Right;  Right thyroid lobectomy with right parathyroid biopsy  . Left breast biopsy 2013    Benign    FAMILY HISTORY: family history includes Breast cancer in her maternal aunt; Breast cancer (age of onset:54) in her sister; Diabetes in her father and paternal grandmother; Heart failure in her father and mother; Hypertension in her mother and sister; Prostate cancer in her maternal uncle; Seizures in her mother; Stomach cancer in her maternal uncle; Stroke in her mother; and Thyroid disease in her mother.  SOCIAL HISTORY:  reports that she has never smoked. She has never used smokeless tobacco. She reports that she does not drink alcohol or use illicit drugs.  ALLERGIES: Review of patient's allergies indicates no known allergies.  MEDICATIONS:  Current Outpatient Prescriptions  Medication Sig Dispense Refill  . calcium-vitamin D (OSCAL 500/200 D-3) 500-200 MG-UNIT per tablet Take 2 tablets by mouth 3 (three) times daily.  180 tablet  3  . oxyCODONE-acetaminophen (PERCOCET) 10-325 MG per tablet Take 10 mg by mouth as needed.      . Vitamin D, Ergocalciferol, (DRISDOL) 50000 UNITS CAPS Take 1.25 mg by mouth Daily.  REVIEW OF SYSTEMS:  A 15 point review of systems is documented in the electronic medical record. This was obtained by the nursing staff. However, I reviewed this with the patient to discuss relevant findings and make appropriate changes.  Pertinent items are noted in HPI.   PHYSICAL EXAM:   height is 5' 6.5" (1.689 m) and weight is 281 lb (127.461 kg). Her temperature is 98.1 F (36.7 C). Her blood pressure is 150/92 and her pulse is 71.   Marland Kitchen He is a pleasant obese female in no distress sitting comfortable on examining table.  LABORATORY DATA:  Lab Results  Component Value Date   WBC 6.5 10/10/2012   HGB 10.3* 10/10/2012   HCT 33.7* 10/10/2012   MCV 76.1* 10/10/2012   PLT 282 10/10/2012   Lab Results  Component Value Date   NA 136 10/19/2012   K 3.9 10/19/2012   CL 104 10/19/2012   CO2 25 10/19/2012   Lab Results  Component Value Date   ALT 20 08/12/2012   AST 18 08/12/2012   ALKPHOS 101 08/12/2012   BILITOT 0.4 08/12/2012     RADIOGRAPHY: Dg Chest 2 View  11/08/2012  *RADIOLOGY REPORT*  Clinical Data: Para thyroid carcinoma  CHEST - 2 VIEW  Comparison: None  Findings: The cardiomediastinal silhouette is unremarkable.  No acute infiltrate or pleural effusion.  No pulmonary edema.  Minimal degenerative changes mid thoracic spine.  IMPRESSION: No active disease.   Original Report Authenticated By: Natasha Mead, M.D.       IMPRESSION: 52 year old female status post right thyroidectomy for a locally advanced parathyroid cancer  PLAN: I spoke to the patient today regarding her diagnosis and options for treatment. She has close margins but no gross residual disease per her surgeon. Review of the medical literature indicates very few cases and certainly no randomized trials to evaluate the role of radiation in improving local control. The largest series from [facility] including 27 patients over 20 years did show an improvement in disease-free survival in patients who undergo radiation. Of 6 patients who received adjuvant radiotherapy only one had local relapse. At the 20 patients who did not receive adjuvant radiotherapy 10 had local relapse. The 10 year survival is 7710%. All tests for hypercalcemia related. 18 patients in the serious however had locally invasive disease. I  discussed the results of this case report with her. We discussed the absence of randomized data. I informed her there was a suggestive benefit of radiation in this series. I discussed with her the process of simulation the making a mask and daily treatments for approximately 6 weeks. We discussed the side effects of treatment including sore throat dysphasia skin discoloration which can be temporary or permanent. I also scheduled her to meet with our financial counselors she was worried about the financial ramifications of treatment. In the end she declined to proceed with treatment. She felt as though there wasn't enough evidence to say that she needed to have treatment and she preferred to just let things proceed onward. I let her know that I had e-mailed a colleague who had likely more experience with this and if he felt strongly that she would benefit from radiation I would call her back. She seemed very comfortable with her decision and we discussed in detail. She has a followup with surgery scheduled soon. I did tell her that if she was unfortunately found to have breast cancer and needed radiation for that I would radiate the parathyroid bed as well  as the breast since she will be coming in for treatment anyway. She was amenable to this. He also has her appointment with genetic counseling scheduled as well. I spent 40 minutes  face to face with the patient and more than 50% of that time was spent in counseling and/or coordination of care.   ------------------------------------------------  Lurline Hare, MD

## 2012-11-28 NOTE — Addendum Note (Signed)
Encounter addended by: Agnes Lawrence, RN on: 11/28/2012 11:04 AM<BR>     Documentation filed: Charges VN

## 2012-11-29 ENCOUNTER — Encounter (INDEPENDENT_AMBULATORY_CARE_PROVIDER_SITE_OTHER): Payer: Self-pay | Admitting: General Surgery

## 2012-11-29 ENCOUNTER — Encounter (INDEPENDENT_AMBULATORY_CARE_PROVIDER_SITE_OTHER): Payer: BC Managed Care – PPO | Admitting: General Surgery

## 2012-11-29 ENCOUNTER — Ambulatory Visit (INDEPENDENT_AMBULATORY_CARE_PROVIDER_SITE_OTHER): Payer: BC Managed Care – PPO | Admitting: General Surgery

## 2012-11-29 VITALS — BP 136/84 | HR 105 | Temp 97.4°F | Resp 18 | Ht 66.0 in | Wt 284.0 lb

## 2012-11-29 DIAGNOSIS — C750 Malignant neoplasm of parathyroid gland: Secondary | ICD-10-CM

## 2012-11-29 NOTE — Progress Notes (Signed)
Patient ID: Tabitha White, female   DOB: 1961-09-02, 52 y.o.   MRN: [id number] The patient is a 52 year old female status post right hemithyroidectomy. 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 Patient has met with Dr. Sharl Ma, Dr. Myrle Sheng, and Dr. Michell Heinrich in consultation. The patient is at this point not to proceed with radiation therapy, would like to see how things go on their own. Patient will continue to follow Dr. Sharl Ma for calcium checks parathyroid hormone checks. The patient is to follow up with genetics as per Dr. Truett Perna.  The patient was really referred for a breast mass that was biopsied and felt to be benign. The patient elected to proceed with excision of this mass in the spring. I Field this isappropriate since a benign mass, and the patient like her parathyroid carcinoma to be settled primarily.  We'll have patient follow back up in 2 months for set up for breast mass excision.

## 2012-12-07 ENCOUNTER — Other Ambulatory Visit: Payer: Self-pay | Admitting: Internal Medicine

## 2012-12-07 DIAGNOSIS — C750 Malignant neoplasm of parathyroid gland: Secondary | ICD-10-CM

## 2012-12-08 ENCOUNTER — Ambulatory Visit
Admission: RE | Admit: 2012-12-08 | Discharge: 2012-12-08 | Disposition: A | Discharge status: Home / Self Care | Payer: BC Managed Care – PPO | Source: Ambulatory Visit | Attending: Internal Medicine | Admitting: Internal Medicine

## 2012-12-08 DIAGNOSIS — C750 Malignant neoplasm of parathyroid gland: Secondary | ICD-10-CM

## 2012-12-19 ENCOUNTER — Other Ambulatory Visit: Payer: Self-pay | Admitting: Internal Medicine

## 2012-12-19 DIAGNOSIS — C750 Malignant neoplasm of parathyroid gland: Secondary | ICD-10-CM

## 2012-12-29 ENCOUNTER — Encounter (HOSPITAL_COMMUNITY): Payer: BC Managed Care – PPO

## 2013-01-16 ENCOUNTER — Ambulatory Visit (HOSPITAL_BASED_OUTPATIENT_CLINIC_OR_DEPARTMENT_OTHER): Payer: BC Managed Care – PPO | Admitting: Genetic Counselor

## 2013-01-16 DIAGNOSIS — C750 Malignant neoplasm of parathyroid gland: Secondary | ICD-10-CM

## 2013-01-17 ENCOUNTER — Encounter: Payer: Self-pay | Admitting: Genetic Counselor

## 2013-01-17 NOTE — Progress Notes (Signed)
Dr. Thornton Papas requested a consultation for genetic counseling and risk assessment for Tabitha White, a 52 y.o. female, for discussion of her personal history of parathyroid carcinoma. [name] presents to clinic today to discuss the possibility of a genetic predisposition to cancer, and to further clarify her risks, as well as her family members' risks for cancer.   HISTORY OF PRESENT ILLNESS: In 2013, at the age of 56, Tabitha White was diagnosed with parathyroid carcinoma. This was treated with partial thyroidectomy and removal of the affected parathyroid.    Past Medical History  Diagnosis Date  . Hyperparathyroidism   . Asthma     as a child, but never used inhaler   . Anemia     per pt. report  . Diabetes mellitus     Gestational, 2000  . Cancer     parathyroid carcinoma    Past Surgical History  Procedure Laterality Date  . Left breast biospy  2010    benign  . Tubal ligation  06/1999    laproscopic  . Thyroid lobectomy  10/18/2012    Procedure: THYROID LOBECTOMY;  Surgeon: Axel Filler, MD;  Location: [facility] OR;  Service: General;  Laterality: Right;  Right thyroid lobectomy with right parathyroid biopsy  . Left breast biopsy  2013    Benign    History  Substance Use Topics  . Smoking status: Never Smoker   . Smokeless tobacco: Never Used  . Alcohol Use: No    REPRODUCTIVE HISTORY AND PERSONAL RISK ASSESSMENT FACTORS: Menarche was at age 9.   Premenopausal Uterus Intact: Yes Ovaries Intact: Yes G3P3A0 , first live birth at age 89  She has not previously undergone treatment for infertility.   OCP use for 20-29 years   She has not used HRT in the past.    FAMILY HISTORY:  We obtained a detailed, 4-generation family history.  Significant diagnoses are listed below: Family History  Problem Relation Age of Onset  . Heart failure Mother   . Hypertension Mother   . Seizures Mother   . Stroke Mother   . Thyroid disease Mother   . Diabetes Father    . Heart failure Father   . Breast cancer Sister 40  . Diabetes Paternal Grandmother   . Hypertension Sister   . Breast cancer Maternal Aunt   . Stomach cancer Maternal Uncle     Great uncle  . Prostate cancer Maternal Uncle   The patient was recently diagnosed with a parathyroid carcinoma.  Her sister was diagnosed with breast cancer at age 34.  The patient's mother died of a stroke at age 51.  She has seven maternal half siblings and four paternal half siblings.  One maternal half brother had prostate cancer.  The patient's maternal grandmother had several siblings, one who had breast cancer in her 41s, one with stomach cancer in his 71s and another with prostate cancer in his 100s. There is no other reported cancer history on either side of the family.  Patient's maternal ancestors are of [place] American and native [place] descent, and paternal ancestors are of African [place] descent. There is no reported Ashkenazi Jewish ancestry. There is no  known consanguinity.  GENETIC COUNSELING RISK ASSESSMENT, DISCUSSION, AND SUGGESTED FOLLOW UP: We reviewed the natural history and genetic etiology of sporadic, familial and hereditary cancer syndromes.  She does not meet medical criteria for genetic testing for breast cancer.  We discussed her parathyroid carcinoma.  The pathology of her parathyroid carcinoma  is not consistent with MEN1.  There is another condition called Hyperparathyroid-jaw tumor (HPT-JT) that we can consider.  We discussed that I will review this and call her in a few weeks to discuss what I learned.  Per the patient's request, we will contact her by telephone to discuss the  Results of my research. A follow up genetic counseling visit will be scheduled if indicated.  The patient was seen for a total of 60 minutes, greater than 50% of which was spent face-to-face counseling.  This plan is being carried out per Dr. Thornton Papas recommendations.  This note will also be sent to the  referring provider via the electronic medical record. The patient will be supplied with a summary of this genetic counseling discussion as well as educational information on the discussed hereditary cancer syndromes following the conclusion of their visit.   Patient was discussed with Dr. Drue Second.   _______________________________________________________________________ For Office Staff:  Number of people involved in session: 3 Was an Intern/ student involved with case: yes

## 2013-01-31 ENCOUNTER — Encounter (INDEPENDENT_AMBULATORY_CARE_PROVIDER_SITE_OTHER): Payer: Self-pay | Admitting: General Surgery

## 2013-01-31 ENCOUNTER — Ambulatory Visit (INDEPENDENT_AMBULATORY_CARE_PROVIDER_SITE_OTHER): Payer: BC Managed Care – PPO | Admitting: General Surgery

## 2013-01-31 VITALS — BP 122/78 | HR 77 | Temp 97.8°F | Resp 18 | Ht 66.0 in | Wt 284.4 lb

## 2013-01-31 DIAGNOSIS — N6320 Unspecified lump in the left breast, unspecified quadrant: Secondary | ICD-10-CM

## 2013-01-31 DIAGNOSIS — N63 Unspecified lump in unspecified breast: Secondary | ICD-10-CM

## 2013-01-31 NOTE — Progress Notes (Signed)
Patient ID: Tabitha White, female   DOB: 03/08/1961, 51 y.o.   MRN: [id number]  Chief Complaint  Patient presents with  . Routine Post Op    reck thyroid    HPI Tabitha White is a 51 y.o. female.  The patient is a 51-year-old female who previously been seen for parathyroid carcinoma status post right thyroid lobectomy. Patient has been seen by Dr. Sherrell, Dr. Kerr, and as well as a geneticist, and radiation oncologist. Upon the patient's first visit she initially had a left breast mass that was suspicious been increasing in size, biopsy that showed benign tissue. Secondary to the radiologist concern we were referred the patient for her surgical excision. HPI  Past Medical History  Diagnosis Date  . Hyperparathyroidism   . Asthma     as a child, but never used inhaler   . Anemia     per pt. report  . Diabetes mellitus     Gestational, 2000  . Cancer     parathyroid carcinoma    Past Surgical History  Procedure Laterality Date  . Left breast biospy  2010    benign  . Tubal ligation  06/1999    laproscopic  . Thyroid lobectomy  10/18/2012    Procedure: THYROID LOBECTOMY;  Surgeon: Shelvy Perazzo, MD;  Location: [facility] OR;  Service: General;  Laterality: Right;  Right thyroid lobectomy with right parathyroid biopsy  . Left breast biopsy  2013    Benign    Family History  Problem Relation Age of Onset  . Heart failure Mother   . Hypertension Mother   . Seizures Mother   . Stroke Mother   . Thyroid disease Mother   . Diabetes Father   . Heart failure Father   . Breast cancer Sister 54  . Diabetes Paternal Grandmother   . Hypertension Sister   . Breast cancer Maternal Aunt   . Stomach cancer Maternal Uncle     Great uncle  . Prostate cancer Maternal Uncle     Social History History  Substance Use Topics  . Smoking status: Never Smoker   . Smokeless tobacco: Never Used  . Alcohol Use: No    No Known Allergies  Current Outpatient Prescriptions    Medication Sig Dispense Refill  . calcium carbonate (TUMS - DOSED IN MG ELEMENTAL CALCIUM) 500 MG chewable tablet Chew 2 tablets by mouth daily. 1000 mg units  tid       No current facility-administered medications for this visit.    Review of Systems Review of Systems  HENT: Negative.   Respiratory: Negative.   Cardiovascular: Negative.   Gastrointestinal: Negative.   Endocrine: Negative.   Neurological: Negative.     Blood pressure 122/78, pulse 77, temperature 97.8 F (36.6 C), temperature source Temporal, resp. rate 18, height 5' 6" (1.676 m), weight 284 lb 6.4 oz (129.003 kg).  Physical Exam Physical Exam  Constitutional: She is oriented to person, place, and time. She appears well-developed and well-nourished.  HENT:  Head: Normocephalic and atraumatic.  Eyes: Conjunctivae and EOM are normal. Pupils are equal, round, and reactive to light.  Neck: Normal range of motion. Neck supple.  Cardiovascular: Normal rate, regular rhythm and normal heart sounds.   Pulmonary/Chest: Effort normal and breath sounds normal. Left breast exhibits mass.    Abdominal: Soft. Bowel sounds are normal.  Musculoskeletal: Normal range of motion.  Neurological: She is alert and oriented to person, place, and time.    Data Reviewed Mammogram reveals   a clip at the base of the mass.  Assessment    51-year-old female status post right thyroid lobectomy for parathyroid carcinoma. Patient also with a left breast mass which is benign in nature it has been increasing in size.     Plan    1. We'll proceed to the operating room for an excision of her left breast mass with Faxitron.  2. We discussed the risks and benefits of surgery. We discussed the fact that should this return malignant in nature we would proceed with mastectomy and sentinel lymph node biopsy. the patient agrees to risks and benefits of this time and wished to proceed.       Monice Lundy Jr., Dejai Schubach 01/31/2013, 4:36 PM    

## 2013-02-16 ENCOUNTER — Encounter (HOSPITAL_COMMUNITY): Payer: Self-pay | Admitting: Pharmacy Technician

## 2013-02-17 ENCOUNTER — Encounter (HOSPITAL_COMMUNITY): Payer: Self-pay

## 2013-02-17 ENCOUNTER — Encounter (HOSPITAL_COMMUNITY)
Admission: RE | Admit: 2013-02-17 | Discharge: 2013-02-17 | Disposition: A | Discharge status: Home / Self Care | Payer: BC Managed Care – PPO | Source: Ambulatory Visit | Attending: General Surgery | Admitting: General Surgery

## 2013-02-17 HISTORY — DX: Other specified postprocedural states: Z98.890

## 2013-02-17 HISTORY — DX: Nausea with vomiting, unspecified: R11.2

## 2013-02-17 LAB — CBC
HCT: 28.7 % — ABNORMAL LOW (ref 36.0–46.0)
Hemoglobin: 8.9 g/dL — ABNORMAL LOW (ref 12.0–15.0)
MCHC: 31 g/dL (ref 30.0–36.0)

## 2013-02-17 LAB — SURGICAL PCR SCREEN
MRSA, PCR: NEGATIVE
Staphylococcus aureus: NEGATIVE

## 2013-02-17 NOTE — Progress Notes (Signed)
Sleep apnea results sent to PCP Dr. Donette Larry.

## 2013-02-17 NOTE — Progress Notes (Signed)
This patient has screened at an elevated risk for obstructive sleep apnea using the STOP Bang tool during a pre-surgical visit. A score of 4 or greater is an elevated risk for sleep apnea.

## 2013-02-17 NOTE — Pre-Procedure Instructions (Signed)
TERIANNA PEGGS  02/17/2013   Your procedure is scheduled on:  Friday February 24, 2013.  Report to [facility] 3rd floor at 5:30 AM.  Call this number if you have problems the morning of surgery: [phone]   Remember:   Do not eat food or drink liquids after midnight.   Take these medicines the morning of surgery with A SIP OF WATER: Acetaminophen (Tylenol) of needed for pain.   Do not wear jewelry, make-up or nail polish.  Do not wear lotions, powders, or perfumes.   Do not shave 48 hours prior to surgery.   Do not bring valuables to the hospital.  Contacts, dentures or bridgework may not be worn into surgery.  Leave suitcase in the car. After surgery it may be brought to your room.  For patients admitted to the hospital, checkout time is 11:00 AM the day of  discharge.   Patients discharged the day of surgery will not be allowed to drive  home.  Name and phone number of your driver: Family/Friend  Special Instructions: Shower using CHG 2 nights before surgery and the night before surgery.  If you shower the day of surgery use CHG.  Use special wash - you have one bottle of CHG for all showers.  You should use approximately 1/3 of the bottle for each shower.   Please read over the following fact sheets that you were given: Pain Booklet, Coughing and Deep Breathing, MRSA Information and Surgical Site Infection Prevention

## 2013-02-17 NOTE — Progress Notes (Signed)
Nurse called and left voicemail inquiring if patient was coming to scheduled 1500 PAT appointment. Direct number left for patient to return call.

## 2013-02-20 NOTE — Progress Notes (Signed)
Anesthesia Chart Review:  Patient is a 51 year old female scheduled for excision of left breast mass with Faxitron by Dr. Derrell Lolling on 02/24/13.  History includes morbid obesity, parathyroid carcinoma s/p right thyroid lobectomy and superior parathyroid biopsy 10/18/12, microcytic anemia, asthma, gestational DM, non-smoker, post-operative N/V, menorrhagia/leiomyoma (according to 08/24/12 note, plans to readdress following her breast and thyroid surgeries).  OSA screening score was 4 or greater. PCP is Dr. Eula Listen.  HEM/ONC is Dr. Truett Perna.  Endocrinologist is Dr. Sharl Ma. GYN: Colin Broach.  Vitals at PAT: HR 100, BP 141/82, R 20, 95%.  Medications: acetaminophen.  CXR on 11/08/12 showed no active disease.  Preoperative labs noted.  H/H 8.9/28.7.  (Her previous H/H on 10/10/12 was 10.3/33.7, 08/12/12 11.0/34.4.).  According to previous notes, there was question if her anemia was due to iron deficiency and blood loss from menorrhagia, but I don't see any definitive anemia work-up as of yet.  (Menorrhagia treatment was going to be further pursued following surgery.)  I've reviewed with Anesthesiologist Dr. Noreene Larsson earlier today.  If patient has no significant symptoms from her anemia, then could likely could  proceed with this procedure from an anesthesia standpoint; but would recommend further evaluation for her anemia in the near future.  I have sent a staff message to Dr. Derrell Lolling re: lab results to ensure future follow-up with Dr. Truett Perna and/or Dr. Audie Box .  Velna Ochs [facility]/Anesthesiology Phone [phone] 02/20/2013 5:28 PM

## 2013-02-22 ENCOUNTER — Other Ambulatory Visit (INDEPENDENT_AMBULATORY_CARE_PROVIDER_SITE_OTHER): Payer: Self-pay | Admitting: General Surgery

## 2013-02-22 DIAGNOSIS — N63 Unspecified lump in unspecified breast: Secondary | ICD-10-CM

## 2013-02-24 HISTORY — PX: BREAST SURGERY: SHX581

## 2013-02-27 MED ORDER — DEXTROSE 5 % IV SOLN
3.0000 g | INTRAVENOUS | Status: AC
  Administered 2013-02-28: 3 g via INTRAVENOUS
  Filled 2013-02-27 (×4): qty 3000

## 2013-02-28 ENCOUNTER — Encounter (HOSPITAL_COMMUNITY)
Admission: RE | Disposition: A | Discharge status: Home / Self Care | Payer: Self-pay | Source: Ambulatory Visit | Attending: General Surgery

## 2013-02-28 ENCOUNTER — Ambulatory Visit (HOSPITAL_COMMUNITY): Payer: BC Managed Care – PPO | Admitting: Vascular Surgery

## 2013-02-28 ENCOUNTER — Encounter (HOSPITAL_COMMUNITY): Payer: Self-pay

## 2013-02-28 ENCOUNTER — Ambulatory Visit (HOSPITAL_COMMUNITY)
Admission: RE | Admit: 2013-02-28 | Discharge: 2013-02-28 | Disposition: A | Discharge status: Home / Self Care | Payer: BC Managed Care – PPO | Source: Ambulatory Visit | Attending: General Surgery | Admitting: General Surgery

## 2013-02-28 ENCOUNTER — Other Ambulatory Visit (INDEPENDENT_AMBULATORY_CARE_PROVIDER_SITE_OTHER): Payer: Self-pay | Admitting: General Surgery

## 2013-02-28 ENCOUNTER — Encounter (HOSPITAL_COMMUNITY): Payer: Self-pay | Admitting: Vascular Surgery

## 2013-02-28 ENCOUNTER — Ambulatory Visit
Admission: RE | Admit: 2013-02-28 | Discharge: 2013-02-28 | Disposition: A | Discharge status: Home / Self Care | Payer: BC Managed Care – PPO | Source: Ambulatory Visit | Attending: General Surgery | Admitting: General Surgery

## 2013-02-28 DIAGNOSIS — Z8632 Personal history of gestational diabetes: Secondary | ICD-10-CM | POA: Insufficient documentation

## 2013-02-28 DIAGNOSIS — D249 Benign neoplasm of unspecified breast: Secondary | ICD-10-CM

## 2013-02-28 DIAGNOSIS — Z803 Family history of malignant neoplasm of breast: Secondary | ICD-10-CM | POA: Insufficient documentation

## 2013-02-28 DIAGNOSIS — E0789 Other specified disorders of thyroid: Secondary | ICD-10-CM | POA: Insufficient documentation

## 2013-02-28 DIAGNOSIS — N63 Unspecified lump in unspecified breast: Secondary | ICD-10-CM

## 2013-02-28 DIAGNOSIS — Z8709 Personal history of other diseases of the respiratory system: Secondary | ICD-10-CM | POA: Insufficient documentation

## 2013-02-28 DIAGNOSIS — Z85858 Personal history of malignant neoplasm of other endocrine glands: Secondary | ICD-10-CM | POA: Insufficient documentation

## 2013-02-28 DIAGNOSIS — E213 Hyperparathyroidism, unspecified: Secondary | ICD-10-CM | POA: Insufficient documentation

## 2013-02-28 HISTORY — PX: BREAST LUMPECTOMY WITH NEEDLE LOCALIZATION: SHX5759

## 2013-02-28 SURGERY — BREAST LUMPECTOMY WITH NEEDLE LOCALIZATION
Anesthesia: General | Site: Breast | Laterality: Left | Wound class: Clean

## 2013-02-28 MED ORDER — ONDANSETRON HCL 4 MG/2ML IJ SOLN
INTRAMUSCULAR | Status: DC | PRN
  Administered 2013-02-28: 4 mg via INTRAVENOUS

## 2013-02-28 MED ORDER — OXYCODONE-ACETAMINOPHEN 10-325 MG PO TABS: 1.0000 | ORAL_TABLET | ORAL | Status: DC | PRN

## 2013-02-28 MED ORDER — LACTATED RINGERS IV SOLN
INTRAVENOUS | Status: DC | PRN
  Administered 2013-02-28 (×2): via INTRAVENOUS

## 2013-02-28 MED ORDER — ROCURONIUM BROMIDE 100 MG/10ML IV SOLN
INTRAVENOUS | Status: DC | PRN
  Administered 2013-02-28: 40 mg via INTRAVENOUS

## 2013-02-28 MED ORDER — PHENYLEPHRINE HCL 10 MG/ML IJ SOLN
INTRAMUSCULAR | Status: DC | PRN
  Administered 2013-02-28 (×3): 80 ug via INTRAVENOUS

## 2013-02-28 MED ORDER — BUPIVACAINE-EPINEPHRINE 0.25% -1:200000 IJ SOLN
INTRAMUSCULAR | Status: DC | PRN
  Administered 2013-02-28: 20 mL

## 2013-02-28 MED ORDER — 0.9 % SODIUM CHLORIDE (POUR BTL) OPTIME
TOPICAL | Status: DC | PRN
  Administered 2013-02-28: 1000 mL

## 2013-02-28 MED ORDER — GLYCOPYRROLATE 0.2 MG/ML IJ SOLN
INTRAMUSCULAR | Status: DC | PRN
  Administered 2013-02-28: 0.4 mg via INTRAVENOUS

## 2013-02-28 MED ORDER — DEXAMETHASONE SODIUM PHOSPHATE 4 MG/ML IJ SOLN
INTRAMUSCULAR | Status: DC | PRN
  Administered 2013-02-28: 4 mg via INTRAVENOUS

## 2013-02-28 MED ORDER — FENTANYL CITRATE 0.05 MG/ML IJ SOLN
INTRAMUSCULAR | Status: DC | PRN
  Administered 2013-02-28 (×5): 50 ug via INTRAVENOUS

## 2013-02-28 MED ORDER — MIDAZOLAM HCL 5 MG/5ML IJ SOLN
INTRAMUSCULAR | Status: DC | PRN
  Administered 2013-02-28: 2 mg via INTRAVENOUS

## 2013-02-28 MED ORDER — LACTATED RINGERS IV SOLN
INTRAVENOUS | Status: DC
  Administered 2013-02-28: 13:00:00 via INTRAVENOUS

## 2013-02-28 MED ORDER — DIPHENHYDRAMINE HCL 50 MG/ML IJ SOLN
INTRAMUSCULAR | Status: DC | PRN
  Administered 2013-02-28: 12.5 mg via INTRAVENOUS

## 2013-02-28 MED ORDER — PROPOFOL 10 MG/ML IV BOLUS
INTRAVENOUS | Status: DC | PRN
  Administered 2013-02-28: 160 mg via INTRAVENOUS

## 2013-02-28 MED ORDER — LIDOCAINE HCL (CARDIAC) 20 MG/ML IV SOLN
INTRAVENOUS | Status: DC | PRN
  Administered 2013-02-28: 30 mg via INTRAVENOUS

## 2013-02-28 MED ORDER — NEOSTIGMINE METHYLSULFATE 1 MG/ML IJ SOLN
INTRAMUSCULAR | Status: DC | PRN
  Administered 2013-02-28: 3 mg via INTRAVENOUS

## 2013-02-28 SURGICAL SUPPLY — 52 items
ADH SKN CLS APL DERMABOND .7 (GAUZE/BANDAGES/DRESSINGS) ×1
APL SKNCLS STERI-STRIP NONHPOA (GAUZE/BANDAGES/DRESSINGS) ×1
APPLIER CLIP 9.375 MED OPEN (MISCELLANEOUS) ×2
APR CLP MED 9.3 20 MLT OPN (MISCELLANEOUS) ×1
BENZOIN TINCTURE PRP APPL 2/3 (GAUZE/BANDAGES/DRESSINGS) ×2 IMPLANT
BLADE SURG 10 STRL SS (BLADE) ×2 IMPLANT
BLADE SURG 15 STRL LF DISP TIS (BLADE) ×1 IMPLANT
BLADE SURG 15 STRL SS (BLADE) ×2
CANISTER SUCTION 2500CC (MISCELLANEOUS) IMPLANT
CHLORAPREP W/TINT 26ML (MISCELLANEOUS) ×2 IMPLANT
CLIP APPLIE 9.375 MED OPEN (MISCELLANEOUS) IMPLANT
CLOTH BEACON ORANGE TIMEOUT ST (SAFETY) ×2 IMPLANT
COVER SURGICAL LIGHT HANDLE (MISCELLANEOUS) ×2 IMPLANT
DERMABOND ADVANCED (GAUZE/BANDAGES/DRESSINGS) ×1
DERMABOND ADVANCED .7 DNX12 (GAUZE/BANDAGES/DRESSINGS) IMPLANT
DEVICE DUBIN SPECIMEN MAMMOGRA (MISCELLANEOUS) ×2 IMPLANT
DRAPE CHEST BREAST 15X10 FENES (DRAPES) ×2 IMPLANT
DRSG TEGADERM 4X4.75 (GAUZE/BANDAGES/DRESSINGS) ×1 IMPLANT
ELECT CAUTERY BLADE 6.4 (BLADE) ×2 IMPLANT
ELECT REM PT RETURN 9FT ADLT (ELECTROSURGICAL) ×2
ELECTRODE REM PT RTRN 9FT ADLT (ELECTROSURGICAL) ×1 IMPLANT
GLOVE BIO SURGEON STRL SZ7.5 (GLOVE) ×4 IMPLANT
GLOVE BIOGEL PI IND STRL 7.5 (GLOVE) IMPLANT
GLOVE BIOGEL PI IND STRL 8 (GLOVE) ×1 IMPLANT
GLOVE BIOGEL PI INDICATOR 7.5 (GLOVE) ×1
GLOVE BIOGEL PI INDICATOR 8 (GLOVE) ×1
GOWN STRL NON-REIN LRG LVL3 (GOWN DISPOSABLE) ×2 IMPLANT
GOWN STRL REIN XL XLG (GOWN DISPOSABLE) ×2 IMPLANT
KIT BASIN OR (CUSTOM PROCEDURE TRAY) ×2 IMPLANT
KIT MARKER MARGIN INK (KITS) IMPLANT
KIT ROOM TURNOVER OR (KITS) ×2 IMPLANT
NDL HYPO 25GX1X1/2 BEV (NEEDLE) ×1 IMPLANT
NEEDLE HYPO 25GX1X1/2 BEV (NEEDLE) ×2 IMPLANT
NS IRRIG 1000ML POUR BTL (IV SOLUTION) ×2 IMPLANT
PACK SURGICAL SETUP 50X90 (CUSTOM PROCEDURE TRAY) ×2 IMPLANT
PAD ARMBOARD 7.5X6 YLW CONV (MISCELLANEOUS) ×2 IMPLANT
PENCIL BUTTON HOLSTER BLD 10FT (ELECTRODE) ×2 IMPLANT
SPONGE GAUZE 4X4 12PLY (GAUZE/BANDAGES/DRESSINGS) ×1 IMPLANT
SPONGE LAP 18X18 X RAY DECT (DISPOSABLE) ×1 IMPLANT
SPONGE LAP 4X18 X RAY DECT (DISPOSABLE) ×2 IMPLANT
STRIP CLOSURE SKIN 1/2X4 (GAUZE/BANDAGES/DRESSINGS) IMPLANT
SUT MON AB 4-0 PC3 18 (SUTURE) ×2 IMPLANT
SUT SILK 2 0 SH (SUTURE) IMPLANT
SUT VIC AB 3-0 SH 18 (SUTURE) ×1 IMPLANT
SUT VIC AB 3-0 SH 27 (SUTURE) ×2
SUT VIC AB 3-0 SH 27XBRD (SUTURE) ×1 IMPLANT
SYR BULB 3OZ (MISCELLANEOUS) ×2 IMPLANT
SYR CONTROL 10ML LL (SYRINGE) ×2 IMPLANT
TOWEL OR 17X24 6PK STRL BLUE (TOWEL DISPOSABLE) ×2 IMPLANT
TOWEL OR 17X26 10 PK STRL BLUE (TOWEL DISPOSABLE) ×2 IMPLANT
TUBE CONNECTING 12X1/4 (SUCTIONS) IMPLANT
YANKAUER SUCT BULB TIP NO VENT (SUCTIONS) IMPLANT

## 2013-02-28 NOTE — Anesthesia Postprocedure Evaluation (Signed)
  Anesthesia Post-op Note  Patient: Tabitha White  Procedure(s) Performed: Procedure(s): LEFT BREAST MASS WITH NEEDLE LOCALIZATION (Left)  Patient Location: PACU  Anesthesia Type:General  Level of Consciousness: awake, alert  and oriented  Airway and Oxygen Therapy: Patient Spontanous Breathing  Post-op Pain: mild  Post-op Assessment: Post-op Vital signs reviewed, Patient's Cardiovascular Status Stable, Respiratory Function Stable, Patent Airway and Pain level controlled  Post-op Vital Signs: stable  Complications: No apparent anesthesia complications

## 2013-02-28 NOTE — Anesthesia Postprocedure Evaluation (Signed)
Anesthesia Post Note  Patient: Tabitha White  Procedure(s) Performed: Procedure(s) (LRB): LEFT BREAST MASS WITH NEEDLE LOCALIZATION (Left)  Anesthesia type: General  Patient location: PACU  Post pain: Pain level controlled and Adequate analgesia  Post assessment: Post-op Vital signs reviewed, Patient's Cardiovascular Status Stable, Respiratory Function Stable, Patent Airway and Pain level controlled  Last Vitals:  Filed Vitals:   02/28/13 1545  BP:   Pulse: 69  Temp:   Resp: 16    Post vital signs: Reviewed and stable  Level of consciousness: awake, alert  and oriented  Complications: No apparent anesthesia complications

## 2013-02-28 NOTE — Anesthesia Procedure Notes (Signed)
Procedure Name: Intubation Date/Time: 02/28/2013 12:55 PM Performed by: Orvilla Fus A Pre-anesthesia Checklist: Patient identified, Emergency Drugs available, Suction available, Patient being monitored and Timeout performed Patient Re-evaluated:Patient Re-evaluated prior to inductionOxygen Delivery Method: Circle system utilized Preoxygenation: Pre-oxygenation with 100% oxygen Intubation Type: IV induction Ventilation: Mask ventilation without difficulty and Oral airway inserted - appropriate to patient size Laryngoscope Size: Mac and 4 Grade View: Grade II Tube type: Oral Tube size: 7.0 mm Number of attempts: 1 Airway Equipment and Method: Stylet and LTA kit utilized Placement Confirmation: ETT inserted through vocal cords under direct vision,  breath sounds checked- equal and bilateral and positive ETCO2 Secured at: 22 cm Tube secured with: Tape Dental Injury: Teeth and Oropharynx as per pre-operative assessment

## 2013-02-28 NOTE — Progress Notes (Signed)
Report given to [facility] rn as caregiver

## 2013-02-28 NOTE — Anesthesia Preprocedure Evaluation (Addendum)
Anesthesia Evaluation  Patient identified by MRN, date of birth, ID band Patient awake    History of Anesthesia Complications (+) PONV  Airway Mallampati: III TM Distance: >3 FB Neck ROM: Full    Dental  (+) Teeth Intact and Dental Advisory Given   Pulmonary asthma (as a child- no inhalers currently) ,          Cardiovascular negative cardio ROS      Neuro/Psych negative neurological ROS  negative psych ROS   GI/Hepatic negative GI ROS, Neg liver ROS,   Endo/Other  diabetes (in the past. No current blood glucose monitoring), Gestational  Renal/GU negative Renal ROS     Musculoskeletal   Abdominal   Peds  Hematology   Anesthesia Other Findings   Reproductive/Obstetrics                           Anesthesia Physical Anesthesia Plan  ASA: III  Anesthesia Plan: General   Post-op Pain Management:    Induction: Intravenous  Airway Management Planned: Oral ETT  Additional Equipment:   Intra-op Plan:   Post-operative Plan: Extubation in OR  Informed Consent: I have reviewed the patients History and Physical, chart, labs and discussed the procedure including the risks, benefits and alternatives for the proposed anesthesia with the patient or authorized representative who has indicated his/her understanding and acceptance.   Dental advisory given  Plan Discussed with:   Anesthesia Plan Comments:         Anesthesia Quick Evaluation

## 2013-02-28 NOTE — Op Note (Signed)
Pre Operative Diagnosis:  Left breast mass  Post Operative Diagnosis: same  Procedure: wire localization and left breast mass biopsy  Surgeon: Dr. Axel Filler  Assistant: none  Anesthesia: GETA  EBL: 20 cc  Complications: none  Counts: reported as correct x 2  Findings:  The patient had a proximally a 3 x 4 cm mass palpable to the left breast. This was preoperatively localized with a radiological wire placement. Clips were previously placed with a needle biopsy were seen in the specimen as was the guidewire.  Indications for procedure:  The patient is a 52 year old female who was recently studied by a diagnostic mammogram. She is found to have a left breast mass, and secondary to the increasing size of the last several years the patient was referred for surgical excision of the mass.  Details of the procedure:  The patient was preoperatively seen by radiology and wire was placed near where the mass was. The patient was taken back to the operating room. The patient was placed in supine position with bilateral SCDs in place. After appropriate anitbiotics were confirmed, a time-out was confirmed and all facts were verified.  A curvilinear incision was made just inferior to the wire placement.  dissection was then undertaken using Bovie cautery to maintain hemostasis. The dissection was then taken down to the chest wall. This was removed in its entirety and marked with a long stitch laterally and a short stitch superiorly. The mass was then placed in the Faxitron to confirm the wire and excision. This was confirmed by radiology. The wound was then reapproximated using a 3-0 Vicryl in interrupted fashion deep in the incision. The skin was reapproximated using a 4 Monocryl in a subcuticular fashion the skin was then dressed with Dermabond.  The patient was taken to the recovery room in stable condition.

## 2013-02-28 NOTE — Transfer of Care (Signed)
Immediate Anesthesia Transfer of Care Note  Patient: Tabitha White  Procedure(s) Performed: Procedure(s): LEFT BREAST MASS WITH NEEDLE LOCALIZATION (Left)  Patient Location: PACU  Anesthesia Type:General  Level of Consciousness: awake, alert  and patient cooperative  Airway & Oxygen Therapy: Patient Spontanous Breathing and Patient connected to face mask oxygen  Post-op Assessment: Report given to PACU RN, Post -op Vital signs reviewed and stable and Patient moving all extremities  Post vital signs: Reviewed and stable  Complications: No apparent anesthesia complications

## 2013-02-28 NOTE — Progress Notes (Signed)
Care of pt assumed by MA Cainan Trull RN 

## 2013-02-28 NOTE — Preoperative (Signed)
Beta Blockers   Reason not to administer Beta Blockers:Not Applicable 

## 2013-02-28 NOTE — Interval H&P Note (Signed)
History and Physical Interval Note:  02/28/2013 12:06 PM  Tabitha White  has presented today for surgery, with the diagnosis of left breast mass  The various methods of treatment have been discussed with the patient and family. After consideration of risks, benefits and other options for treatment, the patient has consented to  Procedure(s): LEFT BREAST MASS WITH NEEDLE LOCALIZATION (Left) as a surgical intervention .  The patient's history has been reviewed, patient examined, no change in status, stable for surgery.  I have reviewed the patient's chart and labs.  Questions were answered to the patient's satisfaction.     Marigene Ehlers., Jed Limerick

## 2013-02-28 NOTE — H&P (View-Only) (Signed)
Patient ID: Tabitha White, female   DOB: [birth year], 52 y.o.   MRN: [id number]  Chief Complaint  Patient presents with  . Routine Post Op    reck thyroid    HPI Tabitha White is a 52 y.o. female.  The patient is a 52 year old female who previously been seen for parathyroid carcinoma status post right thyroid lobectomy. Patient has been seen by Dr. Prentiss Bells, Dr. Sharl Ma, and as well as a geneticist, and radiation oncologist. Upon the patient's first visit she initially had a left breast mass that was suspicious been increasing in size, biopsy that showed benign tissue. Secondary to the radiologist concern we were referred the patient for her surgical excision. HPI  Past Medical History  Diagnosis Date  . Hyperparathyroidism   . Asthma     as a child, but never used inhaler   . Anemia     per pt. report  . Diabetes mellitus     Gestational, 2000  . Cancer     parathyroid carcinoma    Past Surgical History  Procedure Laterality Date  . Left breast biospy  2010    benign  . Tubal ligation  06/1999    laproscopic  . Thyroid lobectomy  10/18/2012    Procedure: THYROID LOBECTOMY;  Surgeon: Axel Filler, MD;  Location: [facility] OR;  Service: General;  Laterality: Right;  Right thyroid lobectomy with right parathyroid biopsy  . Left breast biopsy  2013    Benign    Family History  Problem Relation Age of Onset  . Heart failure Mother   . Hypertension Mother   . Seizures Mother   . Stroke Mother   . Thyroid disease Mother   . Diabetes Father   . Heart failure Father   . Breast cancer Sister 66  . Diabetes Paternal Grandmother   . Hypertension Sister   . Breast cancer Maternal Aunt   . Stomach cancer Maternal Uncle     Great uncle  . Prostate cancer Maternal Uncle     Social History History  Substance Use Topics  . Smoking status: Never Smoker   . Smokeless tobacco: Never Used  . Alcohol Use: No    No Known Allergies  Current Outpatient Prescriptions    Medication Sig Dispense Refill  . calcium carbonate (TUMS - DOSED IN MG ELEMENTAL CALCIUM) 500 MG chewable tablet Chew 2 tablets by mouth daily. 1000 mg units  tid       No current facility-administered medications for this visit.    Review of Systems Review of Systems  HENT: Negative.   Respiratory: Negative.   Cardiovascular: Negative.   Gastrointestinal: Negative.   Endocrine: Negative.   Neurological: Negative.     Blood pressure 122/78, pulse 77, temperature 97.8 F (36.6 C), temperature source Temporal, resp. rate 18, height 5\' 6"  (1.676 m), weight 284 lb 6.4 oz (129.003 kg).  Physical Exam Physical Exam  Constitutional: She is oriented to person, place, and time. She appears well-developed and well-nourished.  HENT:  Head: Normocephalic and atraumatic.  Eyes: Conjunctivae and EOM are normal. Pupils are equal, round, and reactive to light.  Neck: Normal range of motion. Neck supple.  Cardiovascular: Normal rate, regular rhythm and normal heart sounds.   Pulmonary/Chest: Effort normal and breath sounds normal. Left breast exhibits mass.    Abdominal: Soft. Bowel sounds are normal.  Musculoskeletal: Normal range of motion.  Neurological: She is alert and oriented to person, place, and time.    Data Reviewed Mammogram reveals  a clip at the base of the mass.  Assessment    52 year old female status post right thyroid lobectomy for parathyroid carcinoma. Patient also with a left breast mass which is benign in nature it has been increasing in size.     Plan    1. We'll proceed to the operating room for an excision of her left breast mass with Faxitron.  2. We discussed the risks and benefits of surgery. We discussed the fact that should this return malignant in nature we would proceed with mastectomy and sentinel lymph node biopsy. the patient agrees to risks and benefits of this time and wished to proceed.       Marigene Ehlers., Brookelle Pellicane 01/31/2013, 4:36 PM

## 2013-03-01 ENCOUNTER — Encounter (HOSPITAL_COMMUNITY): Payer: Self-pay | Admitting: General Surgery

## 2013-03-06 ENCOUNTER — Encounter (INDEPENDENT_AMBULATORY_CARE_PROVIDER_SITE_OTHER): Payer: Self-pay | Admitting: General Surgery

## 2013-03-06 ENCOUNTER — Telehealth (INDEPENDENT_AMBULATORY_CARE_PROVIDER_SITE_OTHER): Payer: Self-pay | Admitting: General Surgery

## 2013-03-06 NOTE — Telephone Encounter (Signed)
I called Tabitha White and let her know her pathology was benign and there were no findings of malignancy in her pathology report. She has an appointment to see me on Apr 31.  She states aside from some swelling she has no problems or questions.

## 2013-03-09 ENCOUNTER — Telehealth: Payer: Self-pay | Admitting: Oncology

## 2013-03-09 ENCOUNTER — Ambulatory Visit (HOSPITAL_BASED_OUTPATIENT_CLINIC_OR_DEPARTMENT_OTHER): Payer: BC Managed Care – PPO | Admitting: Oncology

## 2013-03-09 VITALS — BP 143/78 | HR 72 | Temp 98.3°F | Resp 20 | Ht 66.0 in | Wt 276.4 lb

## 2013-03-09 DIAGNOSIS — C750 Malignant neoplasm of parathyroid gland: Secondary | ICD-10-CM

## 2013-03-09 DIAGNOSIS — D509 Iron deficiency anemia, unspecified: Secondary | ICD-10-CM

## 2013-03-09 DIAGNOSIS — D649 Anemia, unspecified: Secondary | ICD-10-CM

## 2013-03-09 NOTE — Telephone Encounter (Signed)
gv pt appt schedule for May and October. S/w [facility] and per [facility] pt's account is inactive. Pt needs to call business office before being scheduled for appt. Pt made aware and given info to contact [facility]. Message to [facility].

## 2013-03-09 NOTE — Progress Notes (Signed)
   [facility]    OFFICE PROGRESS NOTE   INTERVAL HISTORY:   She returns as scheduled. She is followed by Dr. Sharl Ma after the parathyroid resection. She saw Dr. Sharl Ma earlier this week. Tabitha White underwent resection of a left breast mass on 02/28/2013 and the pathology revealed a benign hamartoma. She feels well. No specific complaint.  She was noted to have microcytic anemia on the preoperative laboratory. She has a heavy monthly menstrual cycle. No other bleeding. She is not taking iron. Iron has caused constipation in the past.  Objective:  Vital signs in last 24 hours:  Blood pressure 143/78, pulse 72, temperature 98.3 F (36.8 C), temperature source Oral, resp. rate 20, height 5\' 6"  (1.676 m), weight 276 lb 6.4 oz (125.374 kg), last menstrual period 02/12/2013.    HEENT:  Neck without mass Lymphatics: No cervical, supraclavicular, or axillary nodes Resp: Lungs clear bilaterally Cardio: Regular rate and rhythm GI: No hepatosplenomegaly, nontender Vascular: No leg edema Breast: Healing incision at the left breast    Lab Results:  Lab Results  Component Value Date   WBC 5.5 02/17/2013   HGB 8.9* 02/17/2013   HCT 28.7* 02/17/2013   MCV 69.3* 02/17/2013   PLT 299 02/17/2013      Medications: I have reviewed the patient's current medications.  Assessment/Plan: 1. Parathyroid carcinoma, status post a right thyroidectomy on 10/18/2012 confirming a 5 cm parathyroid carcinoma with multiple foci of vascular invasion . She declined adjuvant radiation. 2. History of hypercalcemia secondary to #1-resolved following surgical resection of the parathyroid carcinoma  3. Microcytic anemia,? Iron deficiency related to menses , she will begin a trial of iron and she will submit stool Hemoccult cards. We will refer her to gastroenterology for a screening colonoscopy 4. Left breast mass-status post a core biopsy 08/18/2012 with no evidence of malignancy, she underwent an excisional  biopsy on 02/28/2013 and the pathology revealed a benign hamartoma    Disposition:  She will continue followup with Dr. Sharl Ma for calcium management following the parathyroid resection. She will return for a CBC in one month after beginning a trial of iron therapy. Tabitha White will return for an office visit in 6 months.   Thornton Papas, MD  03/09/2013  9:31 AM

## 2013-03-13 ENCOUNTER — Ambulatory Visit (HOSPITAL_BASED_OUTPATIENT_CLINIC_OR_DEPARTMENT_OTHER): Payer: BC Managed Care – PPO | Admitting: Lab

## 2013-03-13 DIAGNOSIS — D509 Iron deficiency anemia, unspecified: Secondary | ICD-10-CM

## 2013-03-13 DIAGNOSIS — D649 Anemia, unspecified: Secondary | ICD-10-CM

## 2013-03-13 LAB — FECAL OCCULT BLOOD, GUAIAC: Occult Blood: NEGATIVE

## 2013-03-15 ENCOUNTER — Encounter (INDEPENDENT_AMBULATORY_CARE_PROVIDER_SITE_OTHER): Payer: Self-pay | Admitting: General Surgery

## 2013-03-15 ENCOUNTER — Ambulatory Visit (INDEPENDENT_AMBULATORY_CARE_PROVIDER_SITE_OTHER): Payer: BC Managed Care – PPO | Admitting: General Surgery

## 2013-03-15 VITALS — BP 128/68 | HR 89 | Temp 96.2°F | Ht 66.0 in | Wt 283.4 lb

## 2013-03-15 DIAGNOSIS — Z9889 Other specified postprocedural states: Secondary | ICD-10-CM

## 2013-03-15 NOTE — Progress Notes (Signed)
Patient ID: Tabitha White, female   DOB: 10-11-1961, 52 y.o.   MRN: [id number] The patient is a 52 year old female status post left breast mass excision. The patient has been doing well postoperatively from this. She has no complaints at this time.  Pathology: Reveals benign breast tissue without any malignancy. This was discussed with the patient.  On exam: Wounds clean dry and intact. There is no seroma on exam.  Assessment and plan: 52 year old female status post left breast mass excision. Patient also with parathyroid carcinoma. We'll have patient follow back up with [place] in 9 months for physical exam to follow the parathyroid carcinoma. Patient to continue with routine mammograms as previously scheduled.

## 2013-03-21 ENCOUNTER — Telehealth: Payer: Self-pay | Admitting: *Deleted

## 2013-03-21 NOTE — Telephone Encounter (Signed)
Left VM X 2 for patient to call back regarding lab results.

## 2013-03-21 NOTE — Telephone Encounter (Signed)
Message copied by Wandalee Ferdinand on Tue Mar 21, 2013  2:08 PM ------      Message from: Ladene Artist      Created: Tue Mar 14, 2013  8:43 PM       Please call patient, stool cards are negative, continue iron ------

## 2013-03-22 NOTE — Telephone Encounter (Signed)
Made patient aware of results and to continue her po iron.

## 2013-04-06 ENCOUNTER — Other Ambulatory Visit: Payer: BC Managed Care – PPO | Admitting: Lab

## 2013-08-10 IMAGING — CR DG CHEST 2V
2 series · 2 of 2 positions shown · non-contrast
Comparison: None

CLINICAL DATA: Para thyroid carcinoma

CHEST - 2 VIEW

[w chest pa]
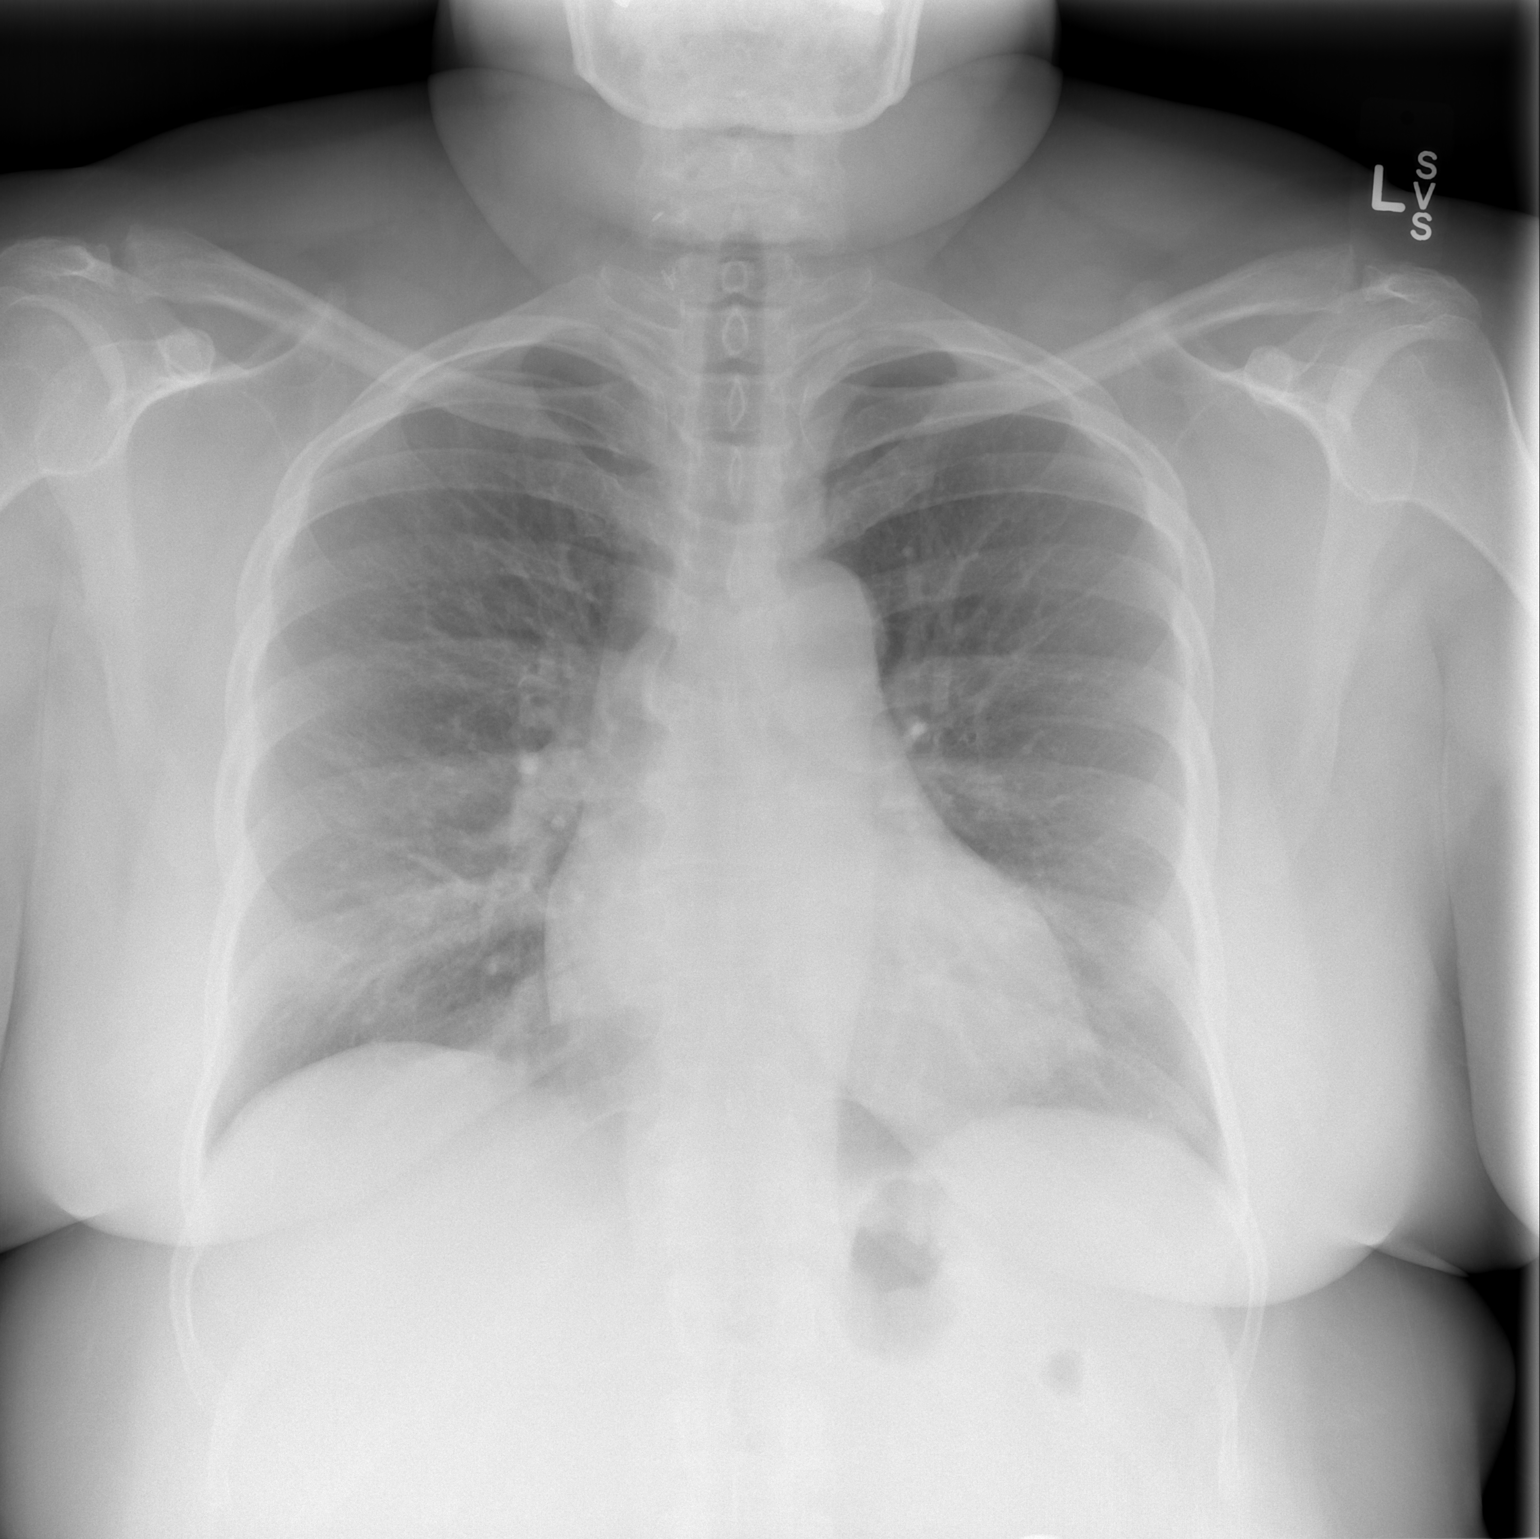

[w chest lat]
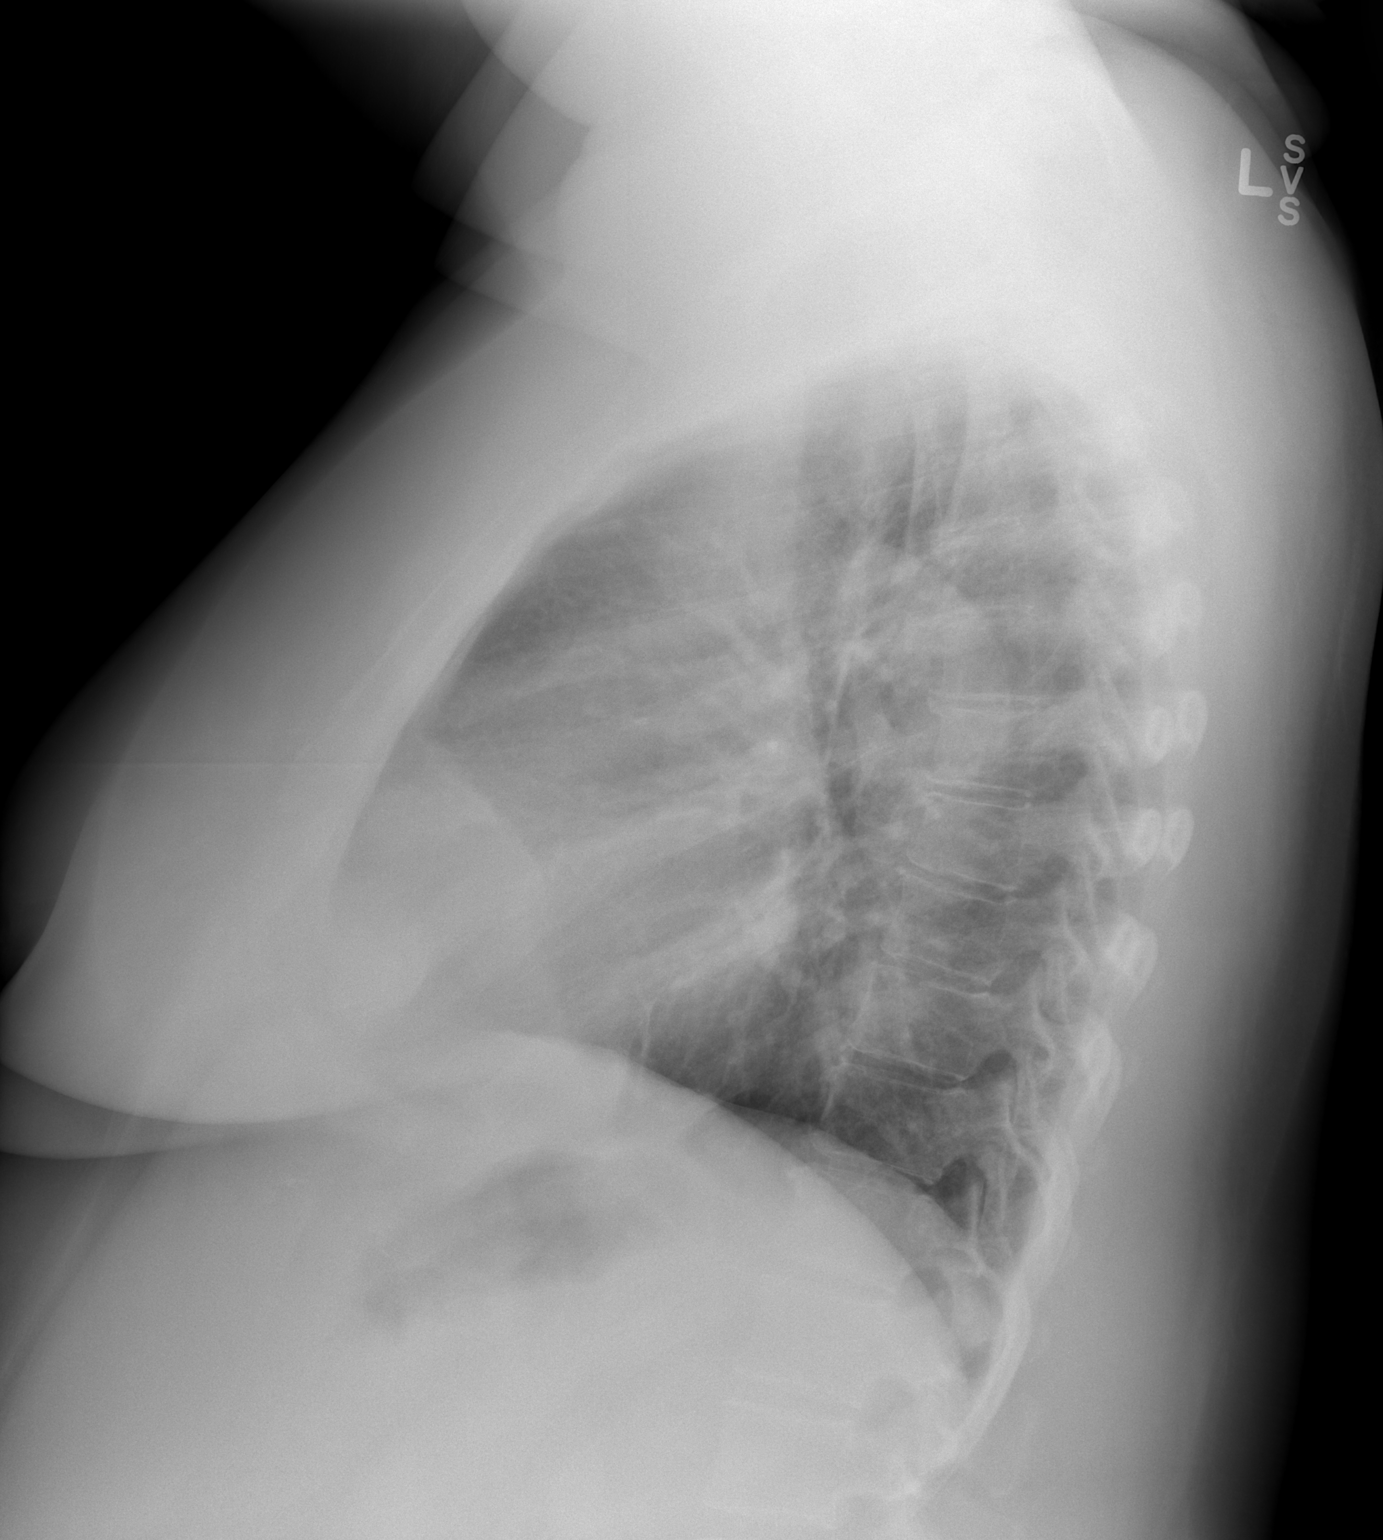

[2 of 2 positions shown; findings below may reference images not displayed]

FINDINGS: The cardiomediastinal silhouette is unremarkable.  No acute
infiltrate or pleural effusion.  No pulmonary edema.  Minimal
degenerative changes mid thoracic spine.
IMPRESSION: No active disease.

## 2013-09-08 ENCOUNTER — Ambulatory Visit: Payer: BC Managed Care – PPO | Admitting: Oncology

## 2013-09-08 ENCOUNTER — Other Ambulatory Visit: Payer: BC Managed Care – PPO | Admitting: Lab

## 2013-09-09 IMAGING — US US SOFT TISSUE HEAD/NECK
1 series · 14 of 25 positions shown · non-contrast
Comparison: Ultrasound of the thyroid of 08/16/2012

CLINICAL DATA: History of surgery in October 2012 for parathyroid
carcinoma with right thyroidectomy

THYROID ULTRASOUND
TECHNIQUE: Ultrasound examination of the thyroid gland and adjacent
soft tissues was performed.

[Series 1: us soft tissue head/neck · 0.12mm/px · 14 of 38 slices shown]
[im 1/38]
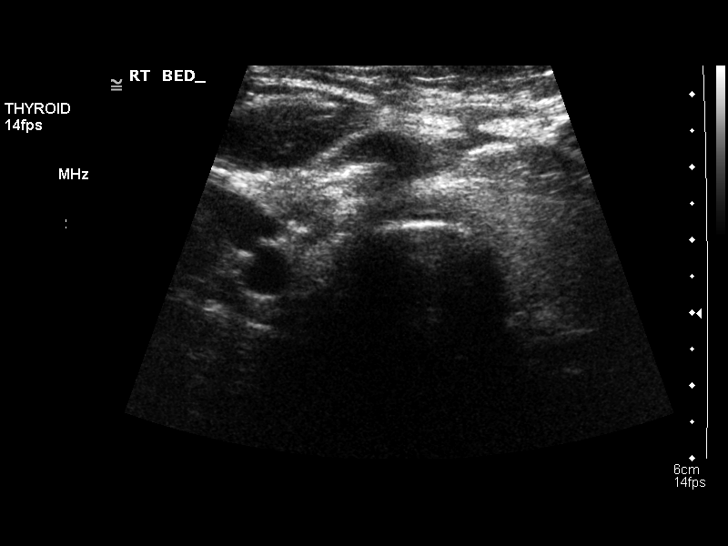
[im 4/38]
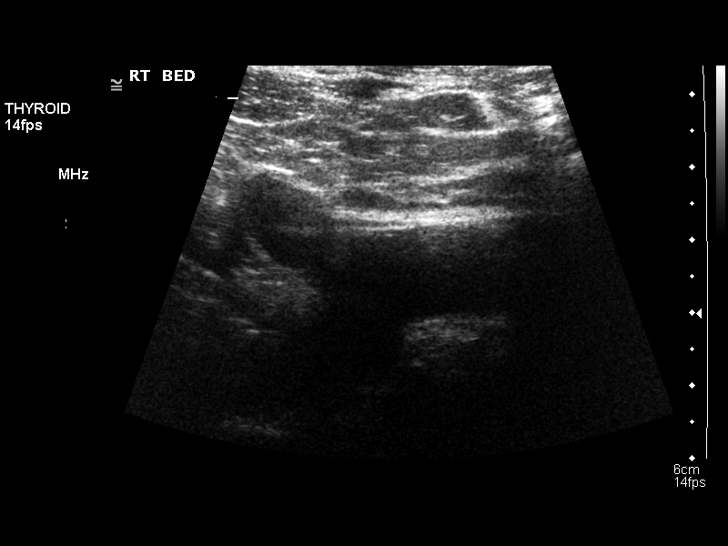
[im 7/38]
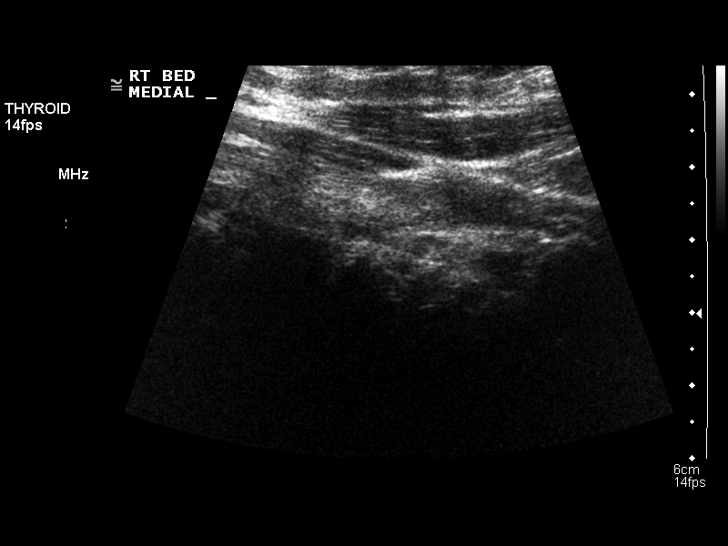
[im 10/38]
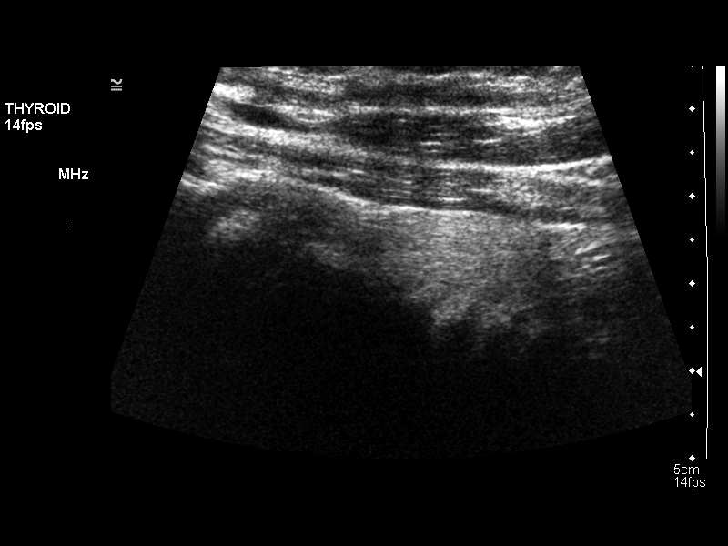
[im 13/38]
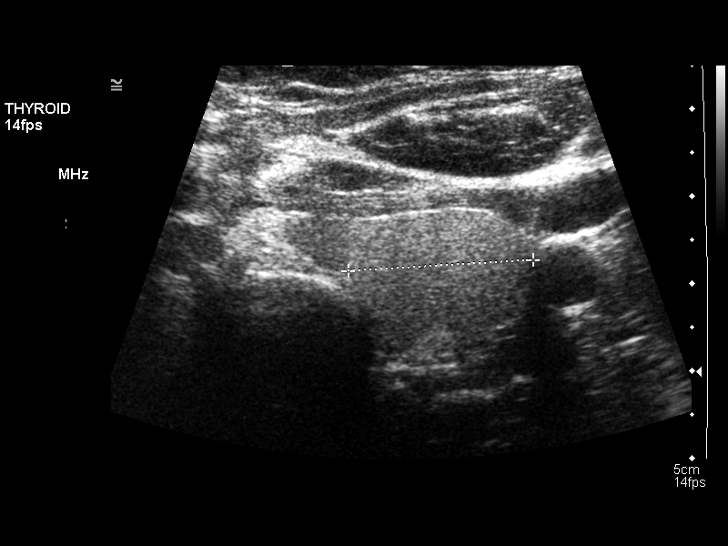
[im 14/38]
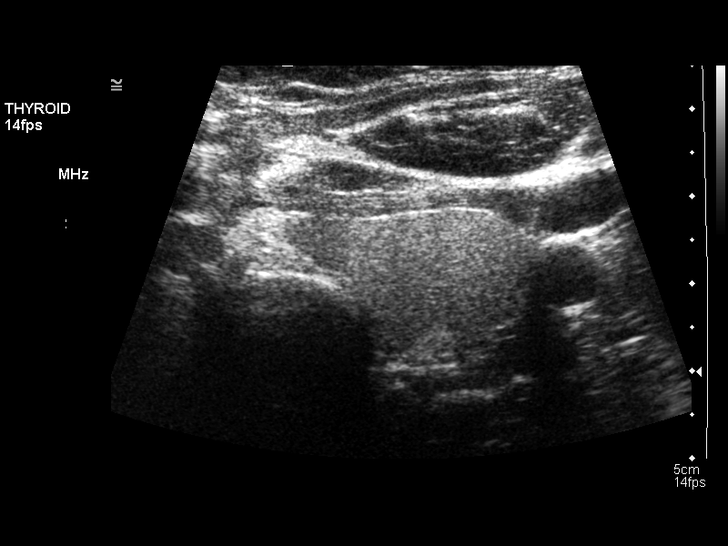
[im 17/38]
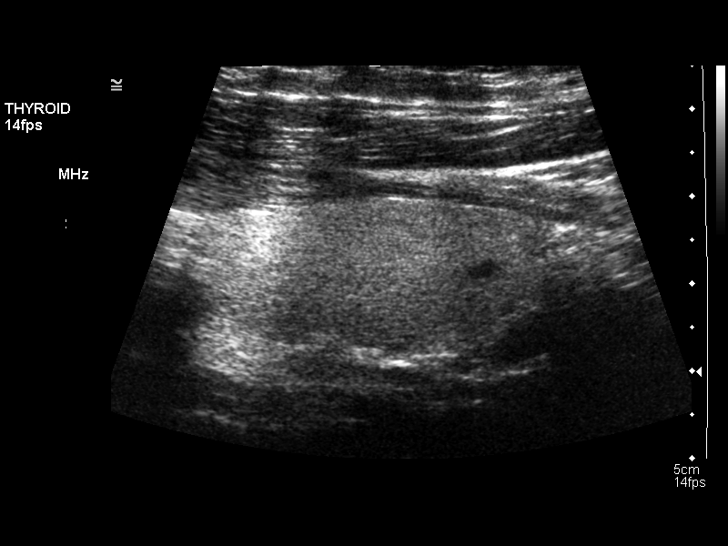
[im 21/38]
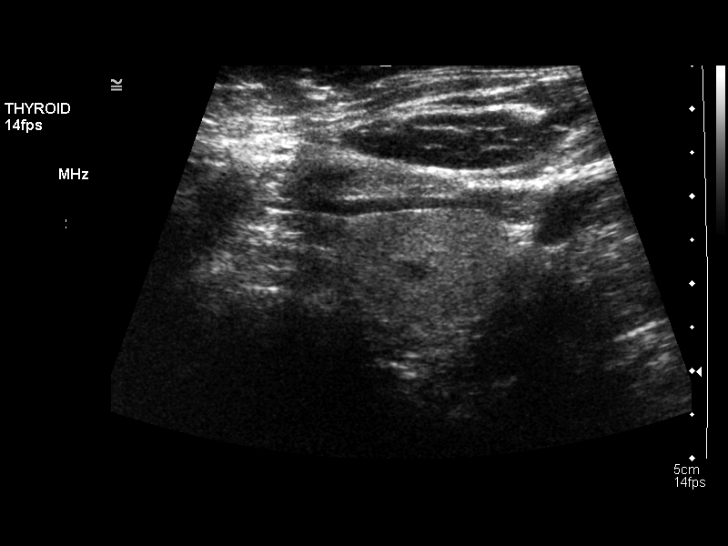
[im 24/38]
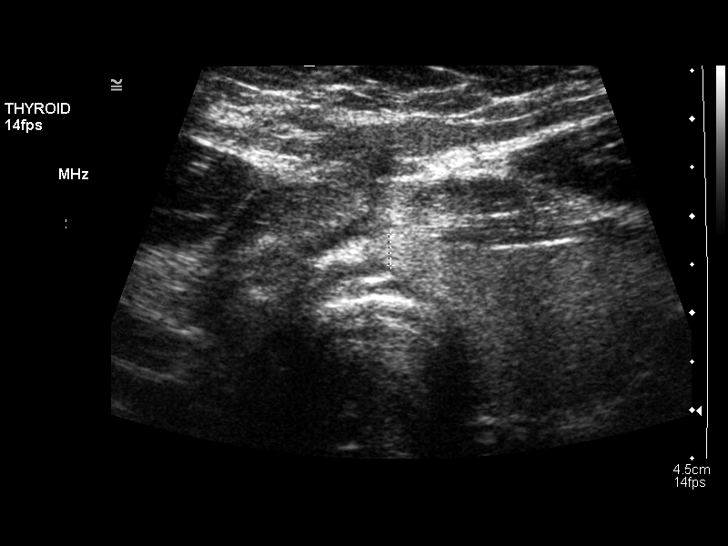
[im 25/38]
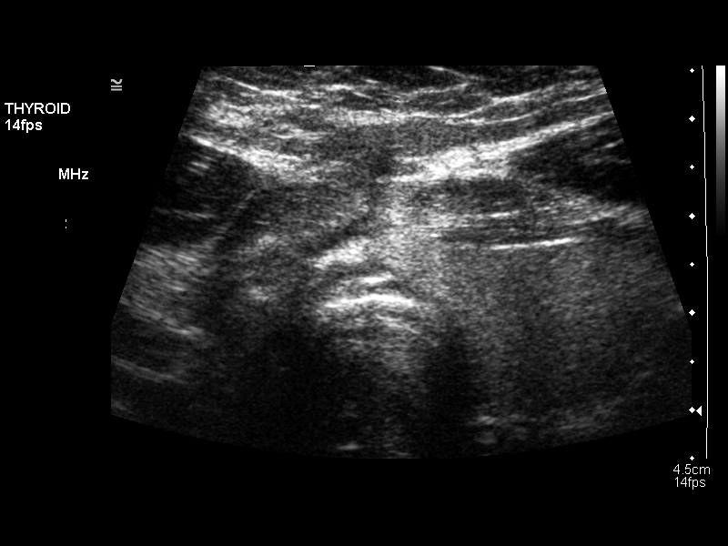
[im 28/38]
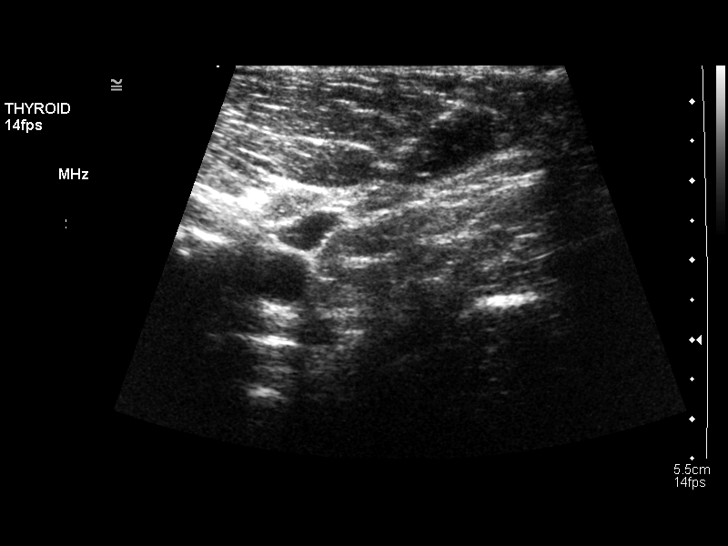
[im 31/38]
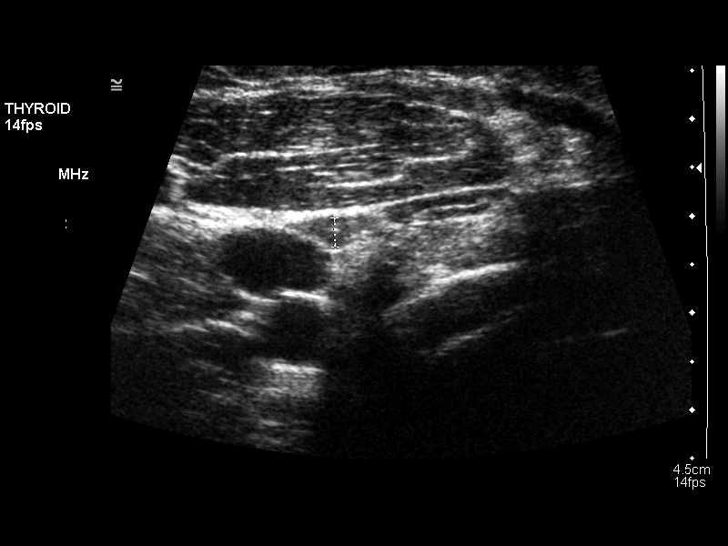
[im 34/38]
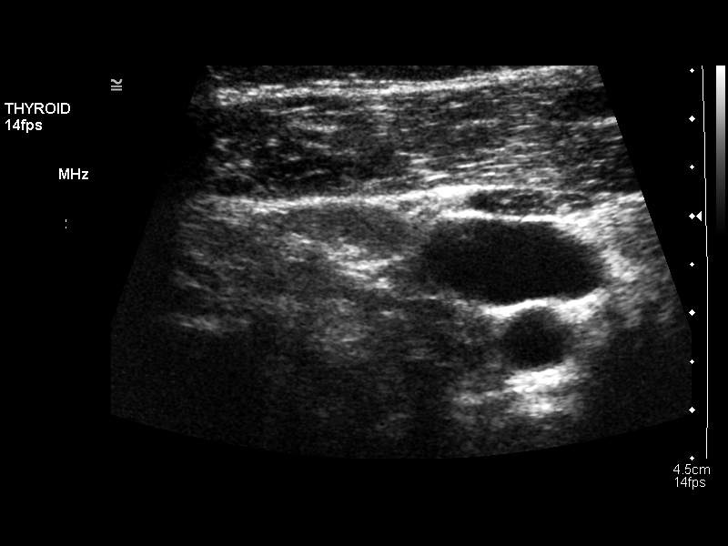
[im 38/38]
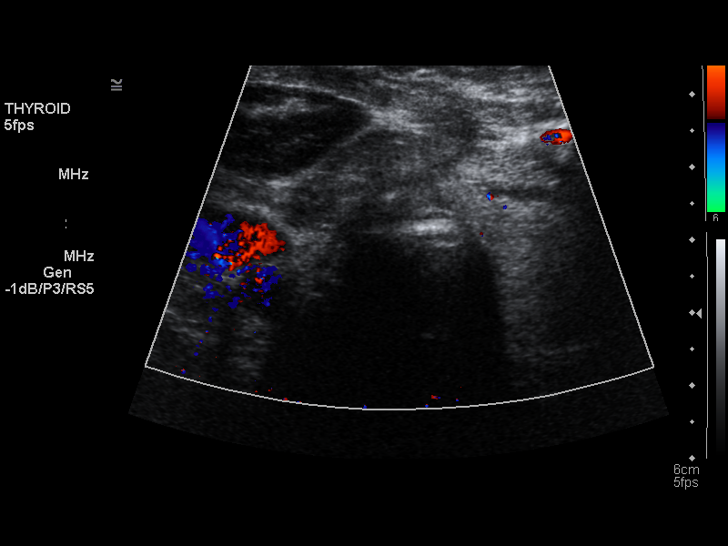

[14 of 25 positions shown; findings below may reference images not displayed]

FINDINGS: Right thyroid lobe:  The right lobe of thyroid has been resected.
Left thyroid lobe:  4.4 x 1.9 x 2.1 cm.  (Previously 4.9 x 1.8 x
2.0 cm).
Isthmus:  3 mm in thickness.

Focal nodules:  The echogenicity of the remaining left lobe of
thyroid is homogeneous.  Only a single hypoechoic nodule is noted
in the lower pole of the left lobe:  3 mm in diameter, unchanged
compared to the prior study.

Lymphadenopathy:  Small lymph nodes are present, with the largest
of 7 mm in short axis diameter.
IMPRESSION: No abnormality of the remaining left lobe of thyroid is seen with
only a tiny 3 mm nodule present.  No adenopathy is noted.  Prior
right thyroidectomy.

## 2014-09-17 ENCOUNTER — Encounter (INDEPENDENT_AMBULATORY_CARE_PROVIDER_SITE_OTHER): Payer: Self-pay | Admitting: General Surgery

## 2014-10-19 ENCOUNTER — Encounter: Payer: Self-pay | Admitting: Gynecology

## 2014-12-14 ENCOUNTER — Encounter: Payer: Self-pay | Admitting: Gynecology

## 2014-12-28 ENCOUNTER — Encounter: Payer: Self-pay | Admitting: Gynecology

## 2015-02-15 ENCOUNTER — Encounter: Payer: Self-pay | Admitting: Gynecology

## 2015-02-27 ENCOUNTER — Ambulatory Visit (INDEPENDENT_AMBULATORY_CARE_PROVIDER_SITE_OTHER): Payer: BC Managed Care – PPO | Admitting: Gynecology

## 2015-02-27 ENCOUNTER — Encounter: Payer: Self-pay | Admitting: Gynecology

## 2015-02-27 ENCOUNTER — Telehealth: Payer: Self-pay

## 2015-02-27 ENCOUNTER — Other Ambulatory Visit (HOSPITAL_COMMUNITY)
Admission: RE | Admit: 2015-02-27 | Discharge: 2015-02-27 | Disposition: A | Discharge status: Home / Self Care | Payer: BC Managed Care – PPO | Source: Ambulatory Visit | Attending: Gynecology | Admitting: Gynecology

## 2015-02-27 VITALS — BP 122/76 | Ht 66.0 in | Wt 284.0 lb

## 2015-02-27 DIAGNOSIS — D251 Intramural leiomyoma of uterus: Secondary | ICD-10-CM

## 2015-02-27 DIAGNOSIS — Z01419 Encounter for gynecological examination (general) (routine) without abnormal findings: Secondary | ICD-10-CM | POA: Insufficient documentation

## 2015-02-27 DIAGNOSIS — N920 Excessive and frequent menstruation with regular cycle: Secondary | ICD-10-CM | POA: Diagnosis not present

## 2015-02-27 LAB — CBC WITH DIFFERENTIAL/PLATELET
Basophils Absolute: 0 10*3/uL (ref 0.0–0.1)
Basophils Relative: 0 % (ref 0–1)
EOS ABS: 0.1 10*3/uL (ref 0.0–0.7)
EOS PCT: 3 % (ref 0–5)
HEMATOCRIT: 30.3 % — AB (ref 36.0–46.0)
Hemoglobin: 9.1 g/dL — ABNORMAL LOW (ref 12.0–15.0)
LYMPHS ABS: 2 10*3/uL (ref 0.7–4.0)
Lymphocytes Relative: 40 % (ref 12–46)
MCH: 21.1 pg — AB (ref 26.0–34.0)
MCHC: 30 g/dL (ref 30.0–36.0)
MCV: 70.3 fL — AB (ref 78.0–100.0)
MONO ABS: 0.5 10*3/uL (ref 0.1–1.0)
MONOS PCT: 10 % (ref 3–12)
MPV: 9.8 fL (ref 8.6–12.4)
Neutro Abs: 2.3 10*3/uL (ref 1.7–7.7)
Neutrophils Relative %: 47 % (ref 43–77)
Platelets: 307 10*3/uL (ref 150–400)
RBC: 4.31 MIL/uL (ref 3.87–5.11)
RDW: 16.2 % — ABNORMAL HIGH (ref 11.5–15.5)
WBC: 4.9 10*3/uL (ref 4.0–10.5)

## 2015-02-27 LAB — LIPID PANEL
CHOL/HDL RATIO: 5.3 ratio
Cholesterol: 117 mg/dL (ref 0–200)
HDL: 22 mg/dL — AB (ref >=46)
LDL CALC: 79 mg/dL (ref 0–99)
TRIGLYCERIDES: 81 mg/dL (ref <150)
VLDL: 16 mg/dL (ref 0–40)

## 2015-02-27 LAB — COMPREHENSIVE METABOLIC PANEL
ALK PHOS: 68 U/L (ref 39–117)
ALT: 17 U/L (ref 0–35)
AST: 20 U/L (ref 0–37)
Albumin: 3.9 g/dL (ref 3.5–5.2)
BILIRUBIN TOTAL: 0.3 mg/dL (ref 0.2–1.2)
BUN: 7 mg/dL (ref 6–23)
CO2: 23 mEq/L (ref 19–32)
Calcium: 8.4 mg/dL (ref 8.4–10.5)
Chloride: 106 mEq/L (ref 96–112)
Creat: 0.67 mg/dL (ref 0.50–1.10)
Glucose, Bld: 106 mg/dL — ABNORMAL HIGH (ref 70–99)
POTASSIUM: 4.4 meq/L (ref 3.5–5.3)
SODIUM: 138 meq/L (ref 135–145)
TOTAL PROTEIN: 6.6 g/dL (ref 6.0–8.3)

## 2015-02-27 LAB — TSH: TSH: 2.111 u[IU]/mL (ref 0.350–4.500)

## 2015-02-27 NOTE — Progress Notes (Signed)
Tabitha White [birth year] [id number]        54 y.o.  G3P3003 for annual exam.  Several issues noted below.  Past medical history,surgical history, problem list, medications, allergies, family history and social history were all reviewed and documented as reviewed in the EPIC chart.  ROS:  Performed with pertinent positives and negatives included in the history, assessment and plan.   Additional significant findings :  none   Exam: Kim Counsellor Vitals:   02/27/15 0836  BP: 122/76  Height: 5\' 6"  (1.676 m)  Weight: 284 lb (128.822 kg)   General appearance:  Normal affect, orientation and appearance. Skin: Grossly normal HEENT: Without gross lesions.  No cervical or supraclavicular adenopathy. Thyroid normal.  Lungs:  Clear without wheezing, rales or rhonchi Cardiac: RR, without RMG Abdominal:  Soft, nontender, without masses, guarding, rebound, organomegaly or hernia Breasts:  Examined lying and sitting without masses, retractions, discharge or axillary adenopathy. Pelvic:  Ext/BUS/vagina normal  Cervix normal  Uterus difficult to palpate but grossly normal in size. Midline, mobile, nontender.  Adnexa  Without gross masses or tenderness    Anus and perineum  Normal   Rectovaginal  Normal sphincter tone without palpated masses or tenderness.    Assessment/Plan:  54 y.o. G56P3003 female for annual exam with regular menses, tubal sterilization.   1. Menorrhagia. Patient notes her menses continue heavy as before. The seem to be getting heavier with frequent pad changes for the first 2 days. Double protection with bleedthrough episodes.  Sonohysterogram to have years ago showed multiple small myomas and negative cavity. Repeat sonohysterogram now as exam is difficult due to abdominal girth. Also check baseline CBC and TSH. Options for management include hormonal manipulation, Mirena IUD, endometrial ablation, surgery such as myomectomy/hysterectomy reviewed. Status post tubal  sterilization in the past. I think she would be a good candidate for endometrial ablation assuming the cavity is normal given her perimenopausal status and hopefully this will get her through till menopause. Will also check FSH now. Patient will follow up for ultrasound. 2. Pap smear/HPV 2013 negative. Pap smear done today. No history of significant abnormal Pap smears previously. 3. Mammography 2013. I reminded the patient she is overdue and she agrees to schedule. Names and numbers provided. SBE monthly reviewed. 4. Colonoscopy never. Strongly recommended patient schedule screening colonoscopy and she agrees. Names and numbers provided. 5. History of parathyroid carcinoma. Actively being followed by oncology. Neck exam is normal with NED. 6. Health maintenance. Patient asked if I would do her baseline lab work although she is actively seeing Dr. Deforest Hoyles. CBC, comprehensive metabolic panel, lipid profile, urinalysis, TSH, FSH ordered.     Anastasio Auerbach MD, 8:57 AM 02/27/2015

## 2015-02-27 NOTE — Addendum Note (Signed)
Addended by: Nelva Nay on: 02/27/2015 09:17 AM   Modules accepted: Orders

## 2015-02-27 NOTE — Patient Instructions (Signed)
Follow up for ultrasound as scheduled  Schedule colonoscopy with [facility] at [phone] or [facility] at [phone]   Call to Schedule your mammogram  Facilities in [place]: 1)  [facility], [address] Phone: [phone] 2)  [facility], [address] Phone: [phone] 3)  Dr. Bertrand at [facility]  [address] Phone: [phone]     Mammogram A mammogram is an X-ray test to find changes in a woman's breast. You should get a mammogram if:  You are 40 years of age or older  You have risk factors.   Your doctor recommends that you have one.  BEFORE THE TEST  Do not schedule the test the week before your period, especially if your breasts are sore during this time.  On the day of your mammogram:  Wash your breasts and armpits well. After washing, do not put on any deodorant or talcum powder on until after your test.   Eat and drink as you usually do.   Take your medicines as usual.   If you are diabetic and take insulin, make sure you:   Eat before coming for your test.   Take your insulin as usual.   If you cannot keep your appointment, call before the appointment to cancel. Schedule another appointment.  TEST  You will need to undress from the waist up. You will put on a hospital gown.   Your breast will be put on the mammogram machine, and it will press firmly on your breast with a piece of plastic called a compression paddle. This will make your breast flatter so that the machine can X-ray all parts of your breast.   Both breasts will be X-rayed. Each breast will be X-rayed from above and from the side. An X-ray might need to be taken again if the picture is not good enough.   The mammogram will last about 15 to 30 minutes.  AFTER THE TEST Finding out the results of your test Ask when your test results will be  ready. Make sure you get your test results.  Document Released: 01/29/2009 Document Revised: 10/22/2011 Document Reviewed: 01/29/2009 ExitCare Patient Information 2012 [employer].    You may obtain a copy of any labs that were done today by logging onto MyChart as outlined in the instructions provided with your AVS (after visit summary). The office will not call with normal lab results but certainly if there are any significant abnormalities then we will contact you.   Health Maintenance, Female A healthy lifestyle and preventative care can promote health and wellness.  Maintain regular health, dental, and eye exams.  Eat a healthy diet. Foods like vegetables, fruits, whole grains, low-fat dairy products, and lean protein foods contain the nutrients you need without too many calories. Decrease your intake of foods high in solid fats, added sugars, and salt. Get information about a proper diet from your caregiver, if necessary.  Regular physical exercise is one of the most important things you can do for your health. Most adults should get at least 150 minutes of moderate-intensity exercise (any activity that increases your heart rate and causes you to sweat) each week. In addition, most adults need muscle-strengthening exercises on 2 or more days a week.   Maintain a healthy weight. The body mass index (BMI) is a screening tool to identify possible weight problems. It provides an estimate of body fat based   on height and weight. Your caregiver can help determine your BMI, and can help you achieve or maintain a healthy weight. For adults 20 years and older:  A BMI below 18.5 is considered underweight.  A BMI of 18.5 to 24.9 is normal.  A BMI of 25 to 29.9 is considered overweight.  A BMI of 30 and above is considered obese.  Maintain normal blood lipids and cholesterol by exercising and minimizing your intake of saturated fat. Eat a balanced diet with plenty of fruits and vegetables.  Blood tests for lipids and cholesterol should begin at age 20 and be repeated every 5 years. If your lipid or cholesterol levels are high, you are over 50, or you are a high risk for heart disease, you may need your cholesterol levels checked more frequently.Ongoing high lipid and cholesterol levels should be treated with medicines if diet and exercise are not effective.  If you smoke, find out from your caregiver how to quit. If you do not use tobacco, do not start.  Lung cancer screening is recommended for adults aged 55 80 years who are at high risk for developing lung cancer because of a history of smoking. Yearly low-dose computed tomography (CT) is recommended for people who have at least a 30-pack-year history of smoking and are a current smoker or have quit within the past 15 years. A pack year of smoking is smoking an average of 1 pack of cigarettes a day for 1 year (for example: 1 pack a day for 30 years or 2 packs a day for 15 years). Yearly screening should continue until the smoker has stopped smoking for at least 15 years. Yearly screening should also be stopped for people who develop a health problem that would prevent them from having lung cancer treatment.  If you are pregnant, do not drink alcohol. If you are breastfeeding, be very cautious about drinking alcohol. If you are not pregnant and choose to drink alcohol, do not exceed 1 drink per day. One drink is considered to be 12 ounces (355 mL) of beer, 5 ounces (148 mL) of wine, or 1.5 ounces (44 mL) of liquor.  Avoid use of street drugs. Do not share needles with anyone. Ask for help if you need support or instructions about stopping the use of drugs.  High blood pressure causes heart disease and increases the risk of stroke. Blood pressure should be checked at least every 1 to 2 years. Ongoing high blood pressure should be treated with medicines, if weight loss and exercise are not effective.  If you are 55 to 53 years old, ask your  caregiver if you should take aspirin to prevent strokes.  Diabetes screening involves taking a blood sample to check your fasting blood sugar level. This should be done once every 3 years, after age 45, if you are within normal weight and without risk factors for diabetes. Testing should be considered at a younger age or be carried out more frequently if you are overweight and have at least 1 risk factor for diabetes.  Breast cancer screening is essential preventative care for women. You should practice "breast self-awareness." This means understanding the normal appearance and feel of your breasts and may include breast self-examination. Any changes detected, no matter how small, should be reported to a caregiver. Women in their 20s and 30s should have a clinical breast exam (CBE) by a caregiver as part of a regular health exam every 1 to 3 years. After age 40, women should   have a CBE every year. Starting at age 40, women should consider having a mammogram (breast X-ray) every year. Women who have a family history of breast cancer should talk to their caregiver about genetic screening. Women at a high risk of breast cancer should talk to their caregiver about having an MRI and a mammogram every year.  Breast cancer gene (BRCA)-related cancer risk assessment is recommended for women who have family members with BRCA-related cancers. BRCA-related cancers include breast, ovarian, tubal, and peritoneal cancers. Having family members with these cancers may be associated with an increased risk for harmful changes (mutations) in the breast cancer genes BRCA1 and BRCA2. Results of the assessment will determine the need for genetic counseling and BRCA1 and BRCA2 testing.  The Pap test is a screening test for cervical cancer. Women should have a Pap test starting at age 21. Between ages 21 and 29, Pap tests should be repeated every 2 years. Beginning at age 30, you should have a Pap test every 3 years as long as the  past 3 Pap tests have been normal. If you had a hysterectomy for a problem that was not cancer or a condition that could lead to cancer, then you no longer need Pap tests. If you are between ages 65 and 70, and you have had normal Pap tests going back 10 years, you no longer need Pap tests. If you have had past treatment for cervical cancer or a condition that could lead to cancer, you need Pap tests and screening for cancer for at least 20 years after your treatment. If Pap tests have been discontinued, risk factors (such as a new sexual partner) need to be reassessed to determine if screening should be resumed. Some women have medical problems that increase the chance of getting cervical cancer. In these cases, your caregiver may recommend more frequent screening and Pap tests.  The human papillomavirus (HPV) test is an additional test that may be used for cervical cancer screening. The HPV test looks for the virus that can cause the cell changes on the cervix. The cells collected during the Pap test can be tested for HPV. The HPV test could be used to screen women aged 30 years and older, and should be used in women of any age who have unclear Pap test results. After the age of 30, women should have HPV testing at the same frequency as a Pap test.  Colorectal cancer can be detected and often prevented. Most routine colorectal cancer screening begins at the age of 50 and continues through age 75. However, your caregiver may recommend screening at an earlier age if you have risk factors for colon cancer. On a yearly basis, your caregiver may provide home test kits to check for hidden blood in the stool. Use of a small camera at the end of a tube, to directly examine the colon (sigmoidoscopy or colonoscopy), can detect the earliest forms of colorectal cancer. Talk to your caregiver about this at age 50, when routine screening begins. Direct examination of the colon should be repeated every 5 to 10 years through  age 75, unless early forms of pre-cancerous polyps or small growths are found.  Hepatitis C blood testing is recommended for all people born from 1945 through 1965 and any individual with known risks for hepatitis C.  Practice safe sex. Use condoms and avoid high-risk sexual practices to reduce the spread of sexually transmitted infections (STIs). Sexually active women aged 25 and younger should be checked for   Chlamydia, which is a common sexually transmitted infection. Older women with new or multiple partners should also be tested for Chlamydia. Testing for other STIs is recommended if you are sexually active and at increased risk.  Osteoporosis is a disease in which the bones lose minerals and strength with aging. This can result in serious bone fractures. The risk of osteoporosis can be identified using a bone density scan. Women ages 65 and over and women at risk for fractures or osteoporosis should discuss screening with their caregivers. Ask your caregiver whether you should be taking a calcium supplement or vitamin D to reduce the rate of osteoporosis.  Menopause can be associated with physical symptoms and risks. Hormone replacement therapy is available to decrease symptoms and risks. You should talk to your caregiver about whether hormone replacement therapy is right for you.  Use sunscreen. Apply sunscreen liberally and repeatedly throughout the day. You should seek shade when your shadow is shorter than you. Protect yourself by wearing long sleeves, pants, a wide-brimmed hat, and sunglasses year round, whenever you are outdoors.  Notify your caregiver of new moles or changes in moles, especially if there is a change in shape or color. Also notify your caregiver if a mole is larger than the size of a pencil eraser.  Stay current with your immunizations. Document Released: 05/18/2011 Document Revised: 02/27/2013 Document Reviewed: 05/18/2011 ExitCare Patient Information 2014 [employer].   

## 2015-02-27 NOTE — Telephone Encounter (Signed)
I called patient to let her know that I checked ins. Benefits for Her Option Ablation. It is covered with $60 copayment then at 100%.  She is scheduled May 6 for [facility].  She said her period to start around 03/11/15 and could last 10 days or could even be bleeding at [facility] appt.   Do you want to try to schedule her for May after [facility] or did you want to wait for [facility] results before proceeding?

## 2015-02-28 LAB — URINALYSIS W MICROSCOPIC + REFLEX CULTURE
BACTERIA UA: NONE SEEN
BILIRUBIN URINE: NEGATIVE
CASTS: NONE SEEN
GLUCOSE, UA: NEGATIVE mg/dL
Hgb urine dipstick: NEGATIVE
Ketones, ur: NEGATIVE mg/dL
LEUKOCYTES UA: NEGATIVE
NITRITE: NEGATIVE
PH: 5.5 (ref 5.0–8.0)
Protein, ur: NEGATIVE mg/dL
SPECIFIC GRAVITY, URINE: 1.028 (ref 1.005–1.030)
Urobilinogen, UA: 0.2 mg/dL (ref 0.0–1.0)

## 2015-02-28 LAB — CYTOLOGY - PAP

## 2015-02-28 LAB — FOLLICLE STIMULATING HORMONE: FSH: 10.8 m[IU]/mL

## 2015-02-28 NOTE — Telephone Encounter (Signed)
Left message to call.

## 2015-02-28 NOTE — Telephone Encounter (Signed)
If she is interested in moving forward with this and you can schedule it. I will need the biopsy results back from the sonohysterogram so it should be several days after the sonohysterogram to allow time for the biopsy results to return and then can be any time after that.

## 2015-02-28 NOTE — Telephone Encounter (Signed)
So, just double checking. It will be okay to start her on Prometrium Day 3 and she will be on that when endometrial biopsy is taken.

## 2015-02-28 NOTE — Telephone Encounter (Signed)
correct 

## 2015-03-01 ENCOUNTER — Other Ambulatory Visit: Payer: Self-pay | Admitting: Gynecology

## 2015-03-01 ENCOUNTER — Encounter: Payer: Self-pay | Admitting: Gynecology

## 2015-03-01 DIAGNOSIS — N921 Excessive and frequent menstruation with irregular cycle: Secondary | ICD-10-CM

## 2015-03-01 NOTE — Telephone Encounter (Signed)
So I spoke with patient about scheduling Her Option Ablation.  I scheduled her for 04/03/15 at 8:30am. I explained to her that she will need someone to drive her to and from the appt. I also explained the need for full bladder and reviewed those instructions with her. Those instructions have also been mailed to her with dates/times on it.  Patient was advised regarding need for Prometrium on Day 3 of menses and she wants to call first day of menses and I will call the Prometrium in at that point. I told her about need for Cytotec tab vaginally hs before surgery but will wait and send that when i send the Prometrium in.  We set her up for a pre op consult with Dr. Loetta Rough for several days before.

## 2015-03-05 ENCOUNTER — Other Ambulatory Visit: Payer: Self-pay | Admitting: Gynecology

## 2015-03-05 DIAGNOSIS — D251 Intramural leiomyoma of uterus: Secondary | ICD-10-CM

## 2015-03-05 DIAGNOSIS — N920 Excessive and frequent menstruation with regular cycle: Secondary | ICD-10-CM

## 2015-03-18 ENCOUNTER — Other Ambulatory Visit: Payer: Self-pay | Admitting: Gynecology

## 2015-03-18 ENCOUNTER — Telehealth: Payer: Self-pay

## 2015-03-18 MED ORDER — MISOPROSTOL 200 MCG PO TABS: ORAL_TABLET | ORAL | Status: DC

## 2015-03-18 MED ORDER — PROGESTERONE MICRONIZED 200 MG PO CAPS: 200.0000 mg | ORAL_CAPSULE | Freq: Every day | ORAL | Status: DC

## 2015-03-18 NOTE — Telephone Encounter (Signed)
Patient called because menses began yesterday. She is schedule for Her Option ablation on 04/03/15.  She knew to call to report so I can send Prometrium Rx. She was instructed to start Prometrium tomorrow on Day 3 and to take at bedtime each day as may make her drowsy.  She was reminded about her appt for this Friday for [facility] and we discussed procedure again.  She was reminded regarding pre op consult on 04/01/15 as well as full bladder day of surgery, May 18, and someone to drive her to and from surgery.  I talked with her about need for Cytotec tab vag hs before surgery and went ahead and sent that in today with Prometrium. She was instructed to hold onto it until hs before surgery.

## 2015-03-22 ENCOUNTER — Ambulatory Visit (INDEPENDENT_AMBULATORY_CARE_PROVIDER_SITE_OTHER): Payer: BC Managed Care – PPO | Admitting: Gynecology

## 2015-03-22 ENCOUNTER — Telehealth: Payer: Self-pay

## 2015-03-22 ENCOUNTER — Ambulatory Visit (INDEPENDENT_AMBULATORY_CARE_PROVIDER_SITE_OTHER): Payer: BC Managed Care – PPO

## 2015-03-22 ENCOUNTER — Encounter: Payer: Self-pay | Admitting: Gynecology

## 2015-03-22 DIAGNOSIS — N920 Excessive and frequent menstruation with regular cycle: Secondary | ICD-10-CM | POA: Diagnosis not present

## 2015-03-22 DIAGNOSIS — D251 Intramural leiomyoma of uterus: Secondary | ICD-10-CM

## 2015-03-22 DIAGNOSIS — N840 Polyp of corpus uteri: Secondary | ICD-10-CM

## 2015-03-22 DIAGNOSIS — N921 Excessive and frequent menstruation with irregular cycle: Secondary | ICD-10-CM

## 2015-03-22 NOTE — Patient Instructions (Signed)
Office will contact you to arrange surgery.

## 2015-03-22 NOTE — Progress Notes (Signed)
Tabitha White 1961-01-27 [id number]        54 y.o.  G3P3003 presents for sonohysterogram secondary to continued heavy menses.  Past medical history,surgical history, problem list, medications, allergies, family history and social history were all reviewed and documented in the EPIC chart.  Directed ROS with pertinent positives and negatives documented in the history of present illness/assessment and plan.  Exam: Sallee Lange assistant There were no vitals filed for this visit. General appearance:  Normal Abdomen soft nontender without masses guarding rebound Pelvic external BUS vagina with menses type flow. Cervix normal. Uterus mildly enlarged but difficult to palpate midline mobile. Adnexa without gross masses.  Ultrasound shows enlarged uterus with multiple myomas measuring 73 mm, 60 mm, 35 mm, 43 mm. Right ovary appears normal. Possible small hydrosalpinx 33 mm x 24 mm x 22 mm. Left ovary is not visualized but adnexa is normal. Endometrial echo 13.4 mm.  Sonohysterogram performed, sterile technique, easy catheter introduction, good distention with lower uterine polyp 34 x 18 x 16 mm. Also some distortion from a sub-mucus myoma but majority within the uterine wall.  Endometrial sample taken. Patient tolerated well.  Assessment/Plan:  54 y.o. [id number] with multiple myomas and menorrhagia. Apparent polyp on hysteroscopy. Patient was scheduled for endometrial ablation later this month but will go ahead and cancel this and plan on hysteroscopy D&C resection of the polyp possible resection of submucous myoma.  I reviewed again options up to and including hysterectomy but at age 24 I think if we can get her through the next year or so she can avoid this. Hemoglobin noted at 9 and iron daily recommended. FSH was normal on recent check. I reviewed with Cymbalta the procedure to include the expected intraoperative and postoperative courses as well as the recovery period. Use of the hysteroscope and  resectoscope discussed. Risks to include bleeding, transfusion, infection requiring prolonged antibiotics, uterine perforation, damage to internal organs including bowel, bladder, ureters, vessels and nerves requiring major exploratory reparative surgeries and future reparative surgeries to include bowel resection ostomy formation bladder repair ureteral damage repair was all discussed. Distended media absorption with metabolic complications such as fluid overload, coma and seizures also discussed.  With the multiple myomas patient clearly understands no guarantees that this will help with her bleeding which may persist or worsen following the procedure. I do think though we need to clear the cavity before proceeding with other definitive treatment such as ablation. Patient agrees with the plan and will go ahead and schedule and follow up for preoperative appointment.    Anastasio Auerbach MD, 9:34 AM 03/22/2015

## 2015-03-22 NOTE — Telephone Encounter (Signed)
Left message for patient to call me to schedule surgery. 

## 2015-03-25 ENCOUNTER — Telehealth: Payer: Self-pay

## 2015-03-25 MED ORDER — MEGESTROL ACETATE 20 MG PO TABS: 20.0000 mg | ORAL_TABLET | Freq: Two times a day (BID) | ORAL | Status: DC

## 2015-03-25 NOTE — Telephone Encounter (Signed)
Encounter already open

## 2015-03-25 NOTE — Telephone Encounter (Signed)
#  1 I would recommend putting her on Megace 20 mg twice a day to suppress bleeding [place] if this doesn't help with her pain #30 then office visit if this does not work. #2 I discussed with her all the options up to and including hysterectomy. As she is 85 we are trying to do as little as possible to address her bleeding and hopefully get her through the menopause without major surgery such as the hysterectomy. We talked about if the hysteroscopy D&C with removal of the polyp/any fibroids in the cavity does not work then we may move on to the ablation.

## 2015-03-25 NOTE — Telephone Encounter (Signed)
I appreciate her frustration. A hysterectomy is not without risks.  If we would be looking towards moving towards that I would consider about treating her with a short course of Depo-Lupron to allow for hemoglobin recovery and minimize the risk of transfusion. I would also need to reexamine her to determine what the best route of hysterectomy would be i.e. Abdominal versus laparoscopic or vaginal. I think given everything together recommend office visit to let me talk to her and reexamine her.

## 2015-03-25 NOTE — Telephone Encounter (Signed)
Patient returned my call regarding scheduling Hyst D&C.  She was informed Biopsy benign.  1. Patient mentioned she has been bleeding since May 1 and has been hurting since the procedure on Friday.  Pain scale 5-6.  Has not taken anything today for pain and is at work.  She said it is both sides and lower abdomen.  She said it is not stabbing but a "medium" pain.  No fever. I offered her to come right over and we will work her in.  She declined office visit and said she would like to just watch it and see how it is tomorrow. I told her if it persists to call in the morning early so we can get her in and have the day to order whatever might be needed.  2.  Patient asked what surgery you will be doing.  I explained Hysteroscopy, D&C to remove endometrial polyp and submucous myoma.  She asked why you are not doing a Hysterectomy.  She asked don't you need to remove all the fibroids and would you be able to do that with resectoscope?  She said you may have answered this Fri but after you told her what you saw in [facility] she said she just didn't hear anything else.

## 2015-03-25 NOTE — Telephone Encounter (Signed)
1.  Patient advised. Rx sent.   2.  Patient feels strongly about wanting to have hysterectomy and "have it all done with". She said she is so tired of dealing with this and the Hyst, D&C doesn't come with guarantees and she could still be dealing with it and having to have even more surgery.

## 2015-03-26 NOTE — Telephone Encounter (Signed)
Patient advised. Appt made.

## 2015-04-01 ENCOUNTER — Institutional Professional Consult (permissible substitution): Payer: BC Managed Care – PPO | Admitting: Gynecology

## 2015-04-03 ENCOUNTER — Ambulatory Visit: Payer: Self-pay | Admitting: Gynecology

## 2015-04-03 ENCOUNTER — Other Ambulatory Visit: Payer: Self-pay

## 2015-04-05 ENCOUNTER — Ambulatory Visit (INDEPENDENT_AMBULATORY_CARE_PROVIDER_SITE_OTHER): Payer: BC Managed Care – PPO | Admitting: Gynecology

## 2015-04-05 ENCOUNTER — Encounter: Payer: Self-pay | Admitting: Gynecology

## 2015-04-05 VITALS — BP 124/78

## 2015-04-05 DIAGNOSIS — N840 Polyp of corpus uteri: Secondary | ICD-10-CM

## 2015-04-05 DIAGNOSIS — N920 Excessive and frequent menstruation with regular cycle: Secondary | ICD-10-CM | POA: Diagnosis not present

## 2015-04-05 DIAGNOSIS — D251 Intramural leiomyoma of uterus: Secondary | ICD-10-CM

## 2015-04-05 NOTE — Progress Notes (Signed)
Tabitha White February 25, 1961 [id number]        54 y.o.  G3P3003 Patient presents to discuss options as far as her heavy menses leiomyoma and endometrial polyp. She had a lot of cramping and discomfort the day after her sonohysterogram was very frustrated and wanted to proceed with hysterectomy.  Past medical history,surgical history, problem list, medications, allergies, family history and social history were all reviewed and documented in the EPIC chart.  Directed ROS with pertinent positives and negatives documented in the history of present illness/assessment and plan.  Exam: Kim assistant Filed Vitals:   04/05/15 1523  BP: 124/78   General appearance:  Normal Abdomen obese without gross masses or tenderness Pelvic external BUS vagina with small classic right Bartholin's cyst. Cervix normal. Uterus difficult to palpate but no gross masses or tenderness  Assessment/Plan:  54 y.o. [id number] . With a long history of menorrhagia, leiomyoma with recent sonohysterogram showing apparent three centimeter endometrial polyp and partial submucous myoma.  I talked about options to include hormonal manipulation to include Depo-Lupron suppression IUD endometrial ablation up to including hysterectomy. I reviewed her age and obesity and risks of surgery particularly with hysterectomy and the risks of having to make an abdominal incision and potential for complications with the incision and months of wound healing. Possible referral for robotic approach. My preference to proceed with hysteroscopy D&C removed endometrial defects and see how it affects her bleeding. Possibly follow this with ablation or Mirena IUD trial. Hopefully get her through menopause avoiding a hysterectomy. After lengthy discussion the patient clearly understands the issues and wants to proceed with hysteroscopy. She understands it may not cure her menorrhagia but certainly worth the attempt and will go ahead and schedule her for this.  She'll represent for a preoperative consult before hand.    Anastasio Auerbach MD, 3:43 PM 04/05/2015

## 2015-04-05 NOTE — Patient Instructions (Signed)
Office will call you to schedule the surgery.

## 2015-04-08 ENCOUNTER — Telehealth: Payer: Self-pay

## 2015-04-08 NOTE — Telephone Encounter (Signed)
I called patient to discuss ins benefits and scheduling surgery. I left her a voice mail message at home # per [facility] access note. I told her I was holding time on June 28 for her, that is the first available date. I did let her know I would be here all day today but out for the rest of the week and returning next Tuesday. If we don't talk today I will hold the time and talk with her next week.

## 2015-04-18 ENCOUNTER — Telehealth: Payer: Self-pay

## 2015-04-18 NOTE — Telephone Encounter (Signed)
I called patient to discuss scheduling surgery.  I discussed her ins benefits and estimated financial responsibility to [facility] with her.  She wants some time to be able to consider this and will call me within the next week or so. I am holding some time on my block morning of 6/28 for her and she will let me know.

## 2015-04-24 ENCOUNTER — Telehealth: Payer: Self-pay

## 2015-04-24 NOTE — Telephone Encounter (Signed)
I left message for patient to call. She was considering 05/14/15 for surgery and was to let me know. I asked her to call me asap as others are waiting for that date.

## 2015-05-06 ENCOUNTER — Telehealth: Payer: Self-pay

## 2015-05-06 NOTE — Telephone Encounter (Signed)
Patient had called me on 04/29/15 to let me know she could not use the date I was holding for her. Needs to look at July .  I called her back on 04/29/15 and asked her about July 5th?  Left message to call.

## 2015-11-21 ENCOUNTER — Telehealth: Payer: Self-pay | Admitting: *Deleted

## 2015-11-21 NOTE — Telephone Encounter (Signed)
Pt called and stated that she started her period yesterday.  This afternoon she began bleeding and having clots. She is passing clots about every 15-20 mins with heavy bleeding. She is having some mild low back cramping but no other pain.  When she passes a clot, it pushes the tampon out. I advised her to come right over for evaluation. 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  An apt was scheduled for first thing in the morning, 11/22/15 820am. She cancelled surgery in June 2016 for polyp and fibroid.  No period up to this point has been this bad. I advised to take 800mg  Ibuprofen q6-8 hrs. After she declined after multiple attempts of bringing her into today,  I advised if she felt at any time that she couldn't wait until tomorrow morning then to go to the [place]

## 2015-11-22 ENCOUNTER — Ambulatory Visit: Payer: BC Managed Care – PPO | Admitting: Gynecology

## 2015-11-22 ENCOUNTER — Telehealth: Payer: Self-pay

## 2015-11-22 NOTE — Telephone Encounter (Addendum)
Patient called stating she is ready to schedule Her Option ablation. Ok to proceed with scheduling?

## 2015-11-22 NOTE — Telephone Encounter (Signed)
Patient had a large polyp on her last sonohysterogram and the plan was hysteroscopy D&C with resection of the polyp and any submucous myoma encountered.  Cannot proceed with ablation on top of a polyp. I would recommend scheduling the hysteroscopy D&C and then reassessing afterwards if her heavy bleeding with continued despite this then proceed with some other form of treatment.

## 2015-11-25 NOTE — Telephone Encounter (Signed)
Left message for her to call me last week.

## 2015-12-05 NOTE — Telephone Encounter (Signed)
Patient called today and spoke with [facility]. She asked that I call her back on her work phone number but when I tried the phone number was disconnected.  I called her at home number and left message for her to call back and talk with me as I will be the one to assist her.

## 2015-12-11 ENCOUNTER — Telehealth: Payer: Self-pay

## 2015-12-11 NOTE — Telephone Encounter (Signed)
Encounter already opened. 

## 2015-12-11 NOTE — Telephone Encounter (Signed)
yes

## 2015-12-11 NOTE — Telephone Encounter (Signed)
She could start on Megace 20 mg daily through the scheduled procedure and that will give [place] more flexibility to schedule.

## 2015-12-11 NOTE — Telephone Encounter (Signed)
Dr. Loetta Rough- So if she starts Megace now are you okay with scheduling surgery 12/24/15?  (LMP 11/20/15)

## 2015-12-11 NOTE — Telephone Encounter (Signed)
Patient called today. I called her back and finally reached her.  I explained that ablation not appropriate due to endo polyp on SHGM last time. Dr. Loetta Rough rec.D&C Hyst. We discussed ins benefits and estimated financial responsibility to [facility].    Upon discussing dates patient said she was hoping to do surgery next week because her last period frightened her so much. She said it was extremely heavy and she passed clots that looked "like beef liver".     Our block Tuesday for next week has been opened previously and is filled as is office schedule. We looked at the next week block on 12/24/15 but her LMP was 11/20/15 and menses will likely be on.  It looks like first block day is 12/31/15 which will cause her to go through another cycle prior to surgery.  Would you want me to look at trying to schedule it another time outside of block scheduling?  Would you want to provide her with Rx to have in case menses is extremely heavy again?

## 2015-12-12 ENCOUNTER — Encounter: Payer: Self-pay | Admitting: Gynecology

## 2015-12-12 ENCOUNTER — Telehealth: Payer: Self-pay

## 2015-12-12 ENCOUNTER — Other Ambulatory Visit: Payer: Self-pay | Admitting: Gynecology

## 2015-12-12 MED ORDER — MEGESTROL ACETATE 20 MG PO TABS: 20.0000 mg | ORAL_TABLET | Freq: Every day | ORAL | Status: DC

## 2015-12-12 MED ORDER — MISOPROSTOL 200 MCG PO TABS: ORAL_TABLET | ORAL | Status: DC

## 2015-12-12 NOTE — Telephone Encounter (Signed)
I called patient to let her know that Dr. Loetta Rough said he can start her on Megace now and that should keep her from bleeding and he could do her surgery 12/24/15.  She was fine with that. Surgery scheduled. Pre op consult scheduled. Megace Rx sent.

## 2015-12-12 NOTE — Telephone Encounter (Signed)
Patient called and I explained the need for Cytotec tab vaginally hs before surgery.  Rx sent.

## 2015-12-12 NOTE — Telephone Encounter (Signed)
Dr. Loetta Rough wants patient to use Cytotec tab vaginally hs night before surgery.  I called and left message for her to call me. I sent Rx to the pharmacy.

## 2015-12-19 ENCOUNTER — Ambulatory Visit (INDEPENDENT_AMBULATORY_CARE_PROVIDER_SITE_OTHER): Payer: BC Managed Care – PPO | Admitting: Gynecology

## 2015-12-19 ENCOUNTER — Encounter: Payer: Self-pay | Admitting: Gynecology

## 2015-12-19 VITALS — BP 124/80

## 2015-12-19 DIAGNOSIS — D251 Intramural leiomyoma of uterus: Secondary | ICD-10-CM | POA: Diagnosis not present

## 2015-12-19 DIAGNOSIS — N920 Excessive and frequent menstruation with regular cycle: Secondary | ICD-10-CM

## 2015-12-19 DIAGNOSIS — N840 Polyp of corpus uteri: Secondary | ICD-10-CM | POA: Diagnosis not present

## 2015-12-19 NOTE — Patient Instructions (Signed)
Followup for surgery as scheduled. 

## 2015-12-19 NOTE — H&P (Signed)
  Tabitha White 01-May-1961 [id number]   History and Physical  Chief complaint: leiomyoma, menorrhagia, endometrial polyp  History of present illness: 55 y.o. G3P3003 with long history of menorrhagia and anemia with hemoglobin in the 9 range. Ultrasound shows enlarged uterus with multiple myomas measuring 73 mm, 60 mm, 35 mm, 43 mm. Sonohysterogram shows lower uterine polyp 34 x 18 x 16 mm and an apparent submucous myoma protruding into the cavity. Endometrial sample showed atrophic endometrium.  Options for management were reviewed with the patient to include observation, hormonal manipulation, trial of Mirena IUD, endometrial ablation and hysterectomy. Recent FSH within the past year was within the normal range.  Given the clear endometrial defects we have decided to proceed with clearing of the endometrial cavity and then following her bleeding pattern afterwards recognizing the possibility that further treatment may be necessary.  The patient is admitted for hysteroscopy D&C with resection of the endometrial polyp and any leiomyoma protruding into the endometrial cavity.     Past medical history,surgical history, medications, allergies, family history and social history were all reviewed and documented in the EPIC chart.  ROS:  Was performed and pertinent positives and negatives are included in the history of present illness.  Exam:  [name] assistant Filed Vitals:   12/19/15 1414  BP: 124/80   General: well developed, well nourished female, no acute distress HEENT: normal  Lungs: clear to auscultation without wheezing, rales or rhonchi  Cardiac: regular rate without rubs, murmurs or gallops  Abdomen: soft, nontender without masses, guarding, rebound, organomegaly  Pelvic: external bus vagina: normal   Cervix: grossly normal  Uterus: difficult to palpate but noted to be generous in size Adnexa: without gross masses or tenderness    Assessment/Plan:  55 y.o. [id number] with  menorrhagia, leiomyoma and apparent endometrial polyp and submucous myoma. Patient admitted for hysteroscopy D&C with resection of endometrial defects.  I reviewed the proposed surgery with the patient to include the expected intraoperative and postoperative courses as well as the recovery period. The use of the hysteroscope, resectoscope and the D&C portion were all discussed. The risks of surgery to include infection, prolonged antibiotics, hemorrhage necessitating transfusion and the risks of transfusion, including transfusion reaction, hepatitis, HIV, mad cow disease and other unknown entities were all discussed understood and accepted. The risk of damage to internal organs during the procedure, either immediately recognized or delay recognized, including vagina, cervix, uterus, possible perforation causing damage to bowel, bladder, ureters, vessels and nerves necessitating major exploratory reparative surgery and future reparative surgeries including bladder repair, ureteral damage repair, bowel resection, ostomy formation was also discussed understood and accepted. The potential for distended media absorption leading to metabolic complications such as fluid overload, coma and seizures was also discussed understood and accepted. She understands there are no guarantees that this will relieve her menorrhagia and her periods may continue heavy or get worse. She understands that I am not removing all of her other fibroids but only those portions that protrude into the endometrial cavity and as such she is at risk for persistence or worsening of her symptoms based on the other leiomyoma.  The patient's questions were answered to her satisfaction and she is ready to proceed with surgery.    Anastasio Auerbach MD, 2:56 PM 12/19/2015

## 2015-12-19 NOTE — Progress Notes (Signed)
Tabitha White 06/05/1961 [id number]   Preoperative Council  Chief complaint: leiomyoma, menorrhagia, endometrial polyp  History of present illness: 55 y.o. G3P3003 with long history of menorrhagia and anemia with hemoglobin in the 9 range. Ultrasound shows enlarged uterus with multiple myomas measuring 73 mm, 60 mm, 35 mm, 43 mm. Sonohysterogram shows lower uterine polyp 34 x 18 x 16 mm and an apparent submucous myoma protruding into the cavity. Endometrial sample showed atrophic endometrium.  Options for management were reviewed with the patient to include observation, hormonal manipulation, trial of Mirena IUD, endometrial ablation and hysterectomy. Recent FSH within the past year was within the normal range.  Given the clear endometrial defects we have decided to proceed with clearing of the endometrial cavity and then following her bleeding pattern afterwards recognizing the possibility that further treatment may be necessary.  The patient is admitted for hysteroscopy D&C with resection of the endometrial polyp and any leiomyoma protruding into the endometrial cavity.     Past medical history,surgical history, medications, allergies, family history and social history were all reviewed and documented in the EPIC chart.  ROS:  Was performed and pertinent positives and negatives are included in the history of present illness.  Exam:  [name] assistant Filed Vitals:   12/19/15 1414  BP: 124/80   General: well developed, well nourished female, no acute distress HEENT: normal  Lungs: clear to auscultation without wheezing, rales or rhonchi  Cardiac: regular rate without rubs, murmurs or gallops  Abdomen: soft, nontender without masses, guarding, rebound, organomegaly  Pelvic: external bus vagina: normal   Cervix: grossly normal  Uterus: difficult to palpate but noted to be generous in size Adnexa: without gross masses or tenderness    Assessment/Plan:  55 y.o. [id number] with  menorrhagia, leiomyoma and apparent endometrial polyp and submucous myoma. Patient admitted for hysteroscopy D&C with resection of endometrial defects.  I reviewed the proposed surgery with the patient to include the expected intraoperative and postoperative courses as well as the recovery period. The use of the hysteroscope, resectoscope and the D&C portion were all discussed. The risks of surgery to include infection, prolonged antibiotics, hemorrhage necessitating transfusion and the risks of transfusion, including transfusion reaction, hepatitis, HIV, mad cow disease and other unknown entities were all discussed understood and accepted. The risk of damage to internal organs during the procedure, either immediately recognized or delay recognized, including vagina, cervix, uterus, possible perforation causing damage to bowel, bladder, ureters, vessels and nerves necessitating major exploratory reparative surgery and future reparative surgeries including bladder repair, ureteral damage repair, bowel resection, ostomy formation was also discussed understood and accepted. The potential for distended media absorption leading to metabolic complications such as fluid overload, coma and seizures was also discussed understood and accepted. She understands there are no guarantees that this will relieve her menorrhagia and her periods may continue heavy or get worse. She understands that I am not removing all of her other fibroids but only those portions that protrude into the endometrial cavity and as such she is at risk for persistence or worsening of her symptoms based on the other leiomyoma.  The patient's questions were answered to her satisfaction and she is ready to proceed with surgery.    Anastasio Auerbach MD, 2:33 PM 12/19/2015

## 2015-12-20 ENCOUNTER — Telehealth: Payer: Self-pay | Admitting: *Deleted

## 2015-12-20 NOTE — Telephone Encounter (Signed)
I called and left message for medical records Pam Minor to fax CBC results to office with attention to my name so give to Dr.Fontaine.

## 2015-12-20 NOTE — Telephone Encounter (Signed)
-----   Message from Anastasio Auerbach, MD sent at 12/19/2015  2:31 PM EST ----- Patient reports having blood work done at her physician's office. Dr. Buddy Duty at [facility]. I'm looking for a CBC result

## 2015-12-20 NOTE — Anesthesia Preprocedure Evaluation (Addendum)
Anesthesia Evaluation  Patient identified by MRN, date of birth, ID band Patient awake    Reviewed: Allergy & Precautions, NPO status , Patient's Chart, lab work & pertinent test results  History of Anesthesia Complications (+) PONV and history of anesthetic complications  Airway Mallampati: III  TM Distance: >3 FB Neck ROM: Full    Dental  (+) Teeth Intact, Dental Advisory Given   Pulmonary asthma (as a child- no inhalers currently) ,    Pulmonary exam normal breath sounds clear to auscultation       Cardiovascular negative cardio ROS Normal cardiovascular exam Rhythm:Regular Rate:Normal     Neuro/Psych negative neurological ROS  negative psych ROS   GI/Hepatic negative GI ROS, Neg liver ROS,   Endo/Other  diabetes (in the past. No current blood glucose monitoring), GestationalMorbid obesity  Renal/GU negative Renal ROS   Menorrhagia, polyp negative genitourinary   Musculoskeletal   Abdominal (+) + obese,  Abdomen: soft.    Peds  Hematology  (+) anemia ,   Anesthesia Other Findings   Reproductive/Obstetrics Endometrial polyp  Menorrhagia                             Anesthesia Physical  Anesthesia Plan  ASA: III  Anesthesia Plan: General   Post-op Pain Management:    Induction: Intravenous  Airway Management Planned: LMA  Additional Equipment:   Intra-op Plan:   Post-operative Plan: Extubation in OR  Informed Consent: I have reviewed the patients History and Physical, chart, labs and discussed the procedure including the risks, benefits and alternatives for the proposed anesthesia with the patient or authorized representative who has indicated his/her understanding and acceptance.   Dental advisory given  Plan Discussed with: CRNA, Anesthesiologist and Surgeon  Anesthesia Plan Comments:       Anesthesia Quick Evaluation

## 2015-12-23 ENCOUNTER — Encounter (HOSPITAL_COMMUNITY): Payer: Self-pay | Admitting: *Deleted

## 2015-12-23 MED ORDER — DEXTROSE 5 % IV SOLN
3.0000 g | INTRAVENOUS | Status: AC
  Administered 2015-12-24: 3 g via INTRAVENOUS
  Filled 2015-12-23: qty 3000

## 2015-12-24 ENCOUNTER — Encounter (HOSPITAL_COMMUNITY)
Admission: RE | Disposition: A | Discharge status: Home / Self Care | Payer: Self-pay | Source: Ambulatory Visit | Attending: Gynecology

## 2015-12-24 ENCOUNTER — Ambulatory Visit (HOSPITAL_COMMUNITY): Payer: BC Managed Care – PPO | Admitting: Anesthesiology

## 2015-12-24 ENCOUNTER — Encounter (HOSPITAL_COMMUNITY): Payer: Self-pay | Admitting: Anesthesiology

## 2015-12-24 ENCOUNTER — Ambulatory Visit (HOSPITAL_COMMUNITY)
Admission: RE | Admit: 2015-12-24 | Discharge: 2015-12-24 | Disposition: A | Discharge status: Home / Self Care | Payer: BC Managed Care – PPO | Source: Ambulatory Visit | Attending: Gynecology | Admitting: Gynecology

## 2015-12-24 DIAGNOSIS — N920 Excessive and frequent menstruation with regular cycle: Secondary | ICD-10-CM | POA: Insufficient documentation

## 2015-12-24 DIAGNOSIS — Z6841 Body Mass Index (BMI) 40.0 and over, adult: Secondary | ICD-10-CM | POA: Insufficient documentation

## 2015-12-24 DIAGNOSIS — D250 Submucous leiomyoma of uterus: Secondary | ICD-10-CM | POA: Insufficient documentation

## 2015-12-24 DIAGNOSIS — D259 Leiomyoma of uterus, unspecified: Secondary | ICD-10-CM | POA: Diagnosis present

## 2015-12-24 DIAGNOSIS — N840 Polyp of corpus uteri: Secondary | ICD-10-CM | POA: Insufficient documentation

## 2015-12-24 HISTORY — PX: DILATATION & CURETTAGE/HYSTEROSCOPY WITH MYOSURE: SHX6511

## 2015-12-24 LAB — CBC
HCT: 34.3 % — ABNORMAL LOW (ref 36.0–46.0)
Hemoglobin: 10.6 g/dL — ABNORMAL LOW (ref 12.0–15.0)
MCH: 24.3 pg — AB (ref 26.0–34.0)
MCHC: 30.9 g/dL (ref 30.0–36.0)
MCV: 78.5 fL (ref 78.0–100.0)
PLATELETS: 330 10*3/uL (ref 150–400)
RBC: 4.37 MIL/uL (ref 3.87–5.11)
RDW: 19.8 % — ABNORMAL HIGH (ref 11.5–15.5)
WBC: 7.8 10*3/uL (ref 4.0–10.5)

## 2015-12-24 LAB — COMPREHENSIVE METABOLIC PANEL
ALT: 17 U/L (ref 14–54)
AST: 18 U/L (ref 15–41)
Albumin: 4 g/dL (ref 3.5–5.0)
Alkaline Phosphatase: 85 U/L (ref 38–126)
Anion gap: 7 (ref 5–15)
BUN: 11 mg/dL (ref 6–20)
CHLORIDE: 106 mmol/L (ref 101–111)
CO2: 26 mmol/L (ref 22–32)
CREATININE: 0.72 mg/dL (ref 0.44–1.00)
Calcium: 8.9 mg/dL (ref 8.9–10.3)
GFR calc non Af Amer: >60 mL/min (ref >60)
GLUCOSE: 136 mg/dL — AB (ref 65–99)
Potassium: 4 mmol/L (ref 3.5–5.1)
SODIUM: 139 mmol/L (ref 135–145)
Total Bilirubin: 0.3 mg/dL (ref 0.3–1.2)
Total Protein: 7.9 g/dL (ref 6.5–8.1)

## 2015-12-24 LAB — PREGNANCY, URINE: Preg Test, Ur: NEGATIVE

## 2015-12-24 SURGERY — DILATATION & CURETTAGE/HYSTEROSCOPY WITH MYOSURE
Anesthesia: General

## 2015-12-24 MED ORDER — ONDANSETRON HCL 4 MG/2ML IJ SOLN
INTRAMUSCULAR | Status: DC | PRN
  Administered 2015-12-24: 4 mg via INTRAVENOUS

## 2015-12-24 MED ORDER — MEPERIDINE HCL 25 MG/ML IJ SOLN: 6.2500 mg | INTRAMUSCULAR | Status: DC | PRN

## 2015-12-24 MED ORDER — DEXAMETHASONE SODIUM PHOSPHATE 10 MG/ML IJ SOLN
INTRAMUSCULAR | Status: DC | PRN
  Administered 2015-12-24: 4 mg via INTRAVENOUS

## 2015-12-24 MED ORDER — KETOROLAC TROMETHAMINE 30 MG/ML IJ SOLN
INTRAMUSCULAR | Status: AC
  Filled 2015-12-24: qty 1

## 2015-12-24 MED ORDER — ONDANSETRON HCL 4 MG/2ML IJ SOLN
INTRAMUSCULAR | Status: AC
  Filled 2015-12-24: qty 2

## 2015-12-24 MED ORDER — METOCLOPRAMIDE HCL 5 MG/ML IJ SOLN: 10.0000 mg | Freq: Once | INTRAMUSCULAR | Status: DC | PRN

## 2015-12-24 MED ORDER — HYDROCODONE-ACETAMINOPHEN 7.5-325 MG PO TABS
1.0000 | ORAL_TABLET | Freq: Once | ORAL | Status: AC | PRN
  Administered 2015-12-24: 1 via ORAL

## 2015-12-24 MED ORDER — HYDROCODONE-ACETAMINOPHEN 5-325 MG PO TABS
ORAL_TABLET | ORAL | Status: DC
Start: 2015-12-24 — End: 2015-12-24
  Filled 2015-12-24: qty 1

## 2015-12-24 MED ORDER — LIDOCAINE HCL (CARDIAC) 20 MG/ML IV SOLN
INTRAVENOUS | Status: AC
  Filled 2015-12-24: qty 5

## 2015-12-24 MED ORDER — LACTATED RINGERS IV SOLN
INTRAVENOUS | Status: DC
  Administered 2015-12-24 (×2): via INTRAVENOUS

## 2015-12-24 MED ORDER — PROPOFOL 10 MG/ML IV BOLUS
INTRAVENOUS | Status: DC | PRN
  Administered 2015-12-24: 200 mg via INTRAVENOUS

## 2015-12-24 MED ORDER — DEXAMETHASONE SODIUM PHOSPHATE 4 MG/ML IJ SOLN
INTRAMUSCULAR | Status: AC
  Filled 2015-12-24: qty 1

## 2015-12-24 MED ORDER — GLYCOPYRROLATE 0.2 MG/ML IJ SOLN
INTRAMUSCULAR | Status: AC
  Filled 2015-12-24: qty 3

## 2015-12-24 MED ORDER — MIDAZOLAM HCL 2 MG/2ML IJ SOLN
INTRAMUSCULAR | Status: DC | PRN
  Administered 2015-12-24: 1 mg via INTRAVENOUS

## 2015-12-24 MED ORDER — HYDROMORPHONE HCL 1 MG/ML IJ SOLN: 0.2500 mg | INTRAMUSCULAR | Status: DC | PRN

## 2015-12-24 MED ORDER — HYDROCODONE-ACETAMINOPHEN 7.5-325 MG PO TABS
ORAL_TABLET | ORAL | Status: AC
  Filled 2015-12-24: qty 1

## 2015-12-24 MED ORDER — SCOPOLAMINE 1 MG/3DAYS TD PT72
MEDICATED_PATCH | TRANSDERMAL | Status: AC
  Filled 2015-12-24: qty 1

## 2015-12-24 MED ORDER — LIDOCAINE HCL 1 % IJ SOLN
INTRAMUSCULAR | Status: AC
  Filled 2015-12-24: qty 20

## 2015-12-24 MED ORDER — SODIUM CHLORIDE 0.9 % IR SOLN
Status: DC | PRN
  Administered 2015-12-24 (×3): 3000 mL

## 2015-12-24 MED ORDER — SCOPOLAMINE 1 MG/3DAYS TD PT72
1.0000 | MEDICATED_PATCH | Freq: Once | TRANSDERMAL | Status: DC
  Administered 2015-12-24: 1.5 mg via TRANSDERMAL

## 2015-12-24 MED ORDER — FENTANYL CITRATE (PF) 100 MCG/2ML IJ SOLN
INTRAMUSCULAR | Status: DC | PRN
  Administered 2015-12-24: 100 ug via INTRAVENOUS

## 2015-12-24 MED ORDER — PROPOFOL 10 MG/ML IV BOLUS
INTRAVENOUS | Status: AC
  Filled 2015-12-24: qty 20

## 2015-12-24 MED ORDER — LIDOCAINE HCL 1 % IJ SOLN
INTRAMUSCULAR | Status: DC | PRN
  Administered 2015-12-24: 10 mL

## 2015-12-24 MED ORDER — FENTANYL CITRATE (PF) 250 MCG/5ML IJ SOLN
INTRAMUSCULAR | Status: AC
  Filled 2015-12-24: qty 5

## 2015-12-24 MED ORDER — LIDOCAINE HCL (CARDIAC) 20 MG/ML IV SOLN
INTRAVENOUS | Status: DC | PRN
  Administered 2015-12-24: 100 mg via INTRAVENOUS

## 2015-12-24 MED ORDER — MIDAZOLAM HCL 2 MG/2ML IJ SOLN
INTRAMUSCULAR | Status: AC
  Filled 2015-12-24: qty 2

## 2015-12-24 MED ORDER — OXYCODONE-ACETAMINOPHEN 5-325 MG PO TABS: 1.0000 | ORAL_TABLET | ORAL | Status: DC | PRN

## 2015-12-24 SURGICAL SUPPLY — 17 items
CATH ROBINSON RED A/P 16FR (CATHETERS) ×3 IMPLANT
CLOTH BEACON ORANGE TIMEOUT ST (SAFETY) ×3 IMPLANT
CONTAINER PREFILL 10% NBF 60ML (FORM) ×6 IMPLANT
DEVICE MYOSURE LITE (MISCELLANEOUS) IMPLANT
DEVICE MYOSURE REACH (MISCELLANEOUS) ×2 IMPLANT
FILTER ARTHROSCOPY CONVERTOR (FILTER) ×3 IMPLANT
GLOVE BIO SURGEON STRL SZ7.5 (GLOVE) ×6 IMPLANT
GLOVE BIOGEL PI IND STRL 7.0 (GLOVE) ×1 IMPLANT
GLOVE BIOGEL PI INDICATOR 7.0 (GLOVE) ×2
GOWN STRL REUS W/TWL LRG LVL3 (GOWN DISPOSABLE) ×6 IMPLANT
PACK VAGINAL MINOR WOMEN LF (CUSTOM PROCEDURE TRAY) ×3 IMPLANT
PAD OB MATERNITY 4.3X12.25 (PERSONAL CARE ITEMS) ×3 IMPLANT
SEAL ROD LENS SCOPE MYOSURE (ABLATOR) ×3 IMPLANT
TOWEL OR 17X24 6PK STRL BLUE (TOWEL DISPOSABLE) ×6 IMPLANT
TUBING AQUILEX INFLOW (TUBING) ×3 IMPLANT
TUBING AQUILEX OUTFLOW (TUBING) ×3 IMPLANT
WATER STERILE IRR 1000ML POUR (IV SOLUTION) ×3 IMPLANT

## 2015-12-24 NOTE — Discharge Instructions (Signed)
° °Postoperative Instructions Hysteroscopy D & C ° °Dr. Fontaine and the nursing staff have discussed postoperative instructions with you.  If you have any questions please ask them before you leave the hospital, or call Dr Fontaine’s office at [phone].   ° °We would like to emphasize the following instructions: ° ° °? Call the office to make your follow-up appointment as recommended by Dr Fontaine (usually 1-2 weeks). ° °? You were given a prescription, or one was ordered for you at the pharmacy you designated.  Get that prescription filled and take the medication according to instructions. ° °? You may eat a regular diet, but slowly until you start having bowel movements. ° °? Drink plenty of water daily. ° °? Nothing in the vagina (intercourse, douching, objects of any kind) for two weeks.  When reinitiating intercourse, if it is uncomfortable, stop and make an appointment with Dr Fontaine to be evaluated. ° °? No driving for one to two days until the effects of anesthesia has worn off.  No traveling out of town for several days. ° °? You may shower, but no baths for one week.  Walking up and down stairs is ok.  No heavy lifting, prolonged standing, repeated bending or any “working out” until your post op check. ° °? Rest frequently, listen to your body and do not push yourself and overdo it. ° °? Call if: ° °o Your pain medication does not seem strong enough. °o Worsening pain or abdominal bloating °o Persistent nausea or vomiting °o Difficulty with urination or bowel movements. °o Temperature of 101 degrees or higher. °o Heavy vaginal bleeding.  If your period is due, you may use tampons. °o You have any questions or concerns ° ° °DISCHARGE INSTRUCTIONS: D&C / D&E °The following instructions have been prepared to help you care for yourself upon your return home. °  °Personal hygiene: °• Use sanitary pads for vaginal drainage, not tampons. °• Shower the day after your procedure. °• NO tub baths, pools or  Jacuzzis for 2-3 weeks. °• Wipe front to back after using the bathroom. ° °Activity and limitations: °• Do NOT drive or operate any equipment for 24 hours. The effects of anesthesia are still present and drowsiness may result. °• Do NOT rest in bed all day. °• Walking is encouraged. °• Walk up and down stairs slowly. °• You may resume your normal activity in one to two days or as indicated by your physician. ° °Sexual activity: NO intercourse for at least 2 weeks after the procedure, or as indicated by your physician. ° °Diet: Eat a light meal as desired this evening. You may resume your usual diet tomorrow. ° °Return to work: You may resume your work activities in one to two days or as indicated by your doctor. ° °What to expect after your surgery: Expect to have vaginal bleeding/discharge for 2-3 days and spotting for up to 10 days. It is not unusual to have soreness for up to 1-2 weeks. You may have a slight burning sensation when you urinate for the first day. Mild cramps may continue for a couple of days. You may have a regular period in 2-6 weeks. ° °Call your doctor for any of the following: °• Excessive vaginal bleeding, saturating and changing one pad every hour. °• Inability to urinate 6 hours after discharge from hospital. °• Pain not relieved by pain medication. °• Fever of 100.4° F or greater. °• Unusual vaginal discharge or odor. ° ° Call for an   appointment:  ° ° °Patient’s signature: ______________________ ° °Nurse’s signature ________________________ ° °Support person's signature_______________________ ° ° ° °

## 2015-12-24 NOTE — Transfer of Care (Signed)
Immediate Anesthesia Transfer of Care Note  Patient: Tabitha White  Procedure(s) Performed: Procedure(s): DILATATION & CURETTAGE/HYSTEROSCOPY WITH MYOSURE (N/A)  Patient Location: PACU  Anesthesia Type:General  Level of Consciousness: awake, alert  and oriented  Airway & Oxygen Therapy: Patient Spontanous Breathing and Patient connected to nasal cannula oxygen  Post-op Assessment: Report given to RN, Post -op Vital signs reviewed and stable and Patient moving all extremities  Post vital signs: Reviewed and stable  Last Vitals:  Filed Vitals:   12/24/15 0620  BP: 124/61  Pulse: 88  Temp: 36.7 C  Resp: 20    Complications: No apparent anesthesia complications

## 2015-12-24 NOTE — H&P (Signed)
  The patient was examined.  I reviewed the proposed surgery and consent form with the patient.  The dictated history and physical is current and accurate and all questions were answered. The patient is ready to proceed with surgery and has a realistic understanding and expectation for the outcome.   Anastasio Auerbach MD, 6:57 AM 12/24/2015

## 2015-12-24 NOTE — Anesthesia Postprocedure Evaluation (Signed)
Anesthesia Post Note  Patient: Tabitha White  Procedure(s) Performed: Procedure(s) (LRB): DILATATION & CURETTAGE/HYSTEROSCOPY WITH MYOSURE (N/A)  Patient location during evaluation: PACU Anesthesia Type: General Level of consciousness: awake and alert and oriented Pain management: pain level controlled Vital Signs Assessment: post-procedure vital signs reviewed and stable Respiratory status: spontaneous breathing, nonlabored ventilation and respiratory function stable Cardiovascular status: blood pressure returned to baseline and stable Postop Assessment: no signs of nausea or vomiting Anesthetic complications: no    Last Vitals:  Filed Vitals:   12/24/15 0830 12/24/15 0845  BP: 115/97 124/79  Pulse: 91 76  Temp:    Resp: 20 15    Last Pain: There were no vitals filed for this visit.               Chassidy Layson A.

## 2015-12-24 NOTE — Telephone Encounter (Signed)
CBC was faxed and placed on Dr.Fontaine desk

## 2015-12-24 NOTE — Anesthesia Procedure Notes (Signed)
Procedure Name: LMA Insertion Date/Time: 12/24/2015 7:18 AM Performed by: Hewitt Blade Pre-anesthesia Checklist: Patient identified, Emergency Drugs available, Suction available and Patient being monitored Patient Re-evaluated:Patient Re-evaluated prior to inductionOxygen Delivery Method: Circle system utilized Preoxygenation: Pre-oxygenation with 100% oxygen Intubation Type: IV induction LMA: LMA inserted LMA Size: 4.0 Number of attempts: 1 Placement Confirmation: positive ETCO2 and breath sounds checked- equal and bilateral Tube secured with: Tape Dental Injury: Teeth and Oropharynx as per pre-operative assessment

## 2015-12-24 NOTE — Op Note (Signed)
Tabitha White 03-Jul-1961 [id number]   Post Operative Note   Date of surgery:  12/24/2015  Pre Op Dx:  Menorrhagia, leiomyoma, endometrial polyp  Post Op Dx:  Menorrhagia, submucous leiomyoma  Procedure:  Hysteroscopy, D&C, Myosure resection submucous leiomyoma  Surgeon:  Donalynn Furlong P  Anesthesia:  General  EBL:  minimal  Distended media discrepancy:  Unmeasurable saline  Complications:  None  Specimen:  #1 submucous myoma fragments #2 endometrial curetting to pathology  Findings: EUA:  External BUS vagina normal. Cervix normal. Uterus generous in size, difficult to palpate due to abdominal girth. Adnexa without gross masses   Hysteroscopy:  Adequate noting right/left tubal ostia, fundus, anterior/posterior endometrial surfaces, lower uterine segment, and the cervical canal visualized. Submucous myoma protruding into the cavity from right lower posterior endometrial surface. 90% resected  Procedure:  The patient was taken to the operating room, was placed in the low dorsal lithotomy position, underwent general anesthesia, received a perineal/vaginal preparation with Betadine solution per nursing personnel and the bladder was emptied with in and out Foley catheterization. The timeout was performed by the surgical team. An EUA was performed. The patient was draped in the usual fashion. The cervix was visualized with a speculum, anterior lip grasped with a single-tooth tenaculum and a paracervical block was placed using 10 cc's of 1% lidocaine. The cervix was gently dilated to admit the Myosure hysteroscope and hysteroscopy was performed with findings noted above. Using the Myosure Reach resectoscopic wand the myoma was resected in several passes,intermittently decreasing pressures to allow the remaining myoma to protrude into the cavity resecting to the level the surrounding endometrium. Approximately 10% of the myoma was left.  A gentle sharp curettage was performed. Both specimens  were sent separately to pathology.  Repeat hysteroscopy showed an empty cavity with good distention and no evidence of perforation. The instruments were removed and adequate hemostasis was visualized at the tenaculum site and external cervical os. Estimation of distended media discrepancy was not possible due to abundant spillage on the floor.  The patient was awakened without difficulty and was taken to the recovery room in good condition having tolerated the procedure well.     Anastasio Auerbach MD, 8:15 AM 12/24/2015

## 2015-12-25 ENCOUNTER — Telehealth: Payer: Self-pay | Admitting: Oncology

## 2015-12-25 ENCOUNTER — Telehealth: Payer: Self-pay

## 2015-12-25 ENCOUNTER — Encounter (HOSPITAL_COMMUNITY): Payer: Self-pay | Admitting: Gynecology

## 2015-12-25 ENCOUNTER — Other Ambulatory Visit: Payer: Self-pay | Admitting: *Deleted

## 2015-12-25 NOTE — Telephone Encounter (Signed)
S/w pt confirming MD visit due to her Enemia count, pt states she will see her PCP for this, cancelled her apt and sent msg to MD... KJ

## 2015-12-25 NOTE — Telephone Encounter (Signed)
I notified patient of results. She is doing great. She asked about size of fibroid and said she would just talk with you about it when she comes in. She said you told her but she does not remember. She said you showed her a picture and she hopes you will have a copy of the picture for her when she comes in.

## 2015-12-25 NOTE — Telephone Encounter (Signed)
-----   Message from Anastasio Auerbach, MD sent at 12/25/2015  2:57 PM EST ----- Tell patient that the pathology from surgery showed a benign fibroid.

## 2016-01-01 ENCOUNTER — Ambulatory Visit: Payer: BC Managed Care – PPO | Admitting: Nurse Practitioner

## 2016-01-02 ENCOUNTER — Telehealth: Payer: Self-pay | Admitting: *Deleted

## 2016-01-02 ENCOUNTER — Ambulatory Visit (INDEPENDENT_AMBULATORY_CARE_PROVIDER_SITE_OTHER): Payer: BC Managed Care – PPO | Admitting: Gynecology

## 2016-01-02 ENCOUNTER — Encounter: Payer: Self-pay | Admitting: Gynecology

## 2016-01-02 VITALS — BP 124/78

## 2016-01-02 DIAGNOSIS — D251 Intramural leiomyoma of uterus: Secondary | ICD-10-CM

## 2016-01-02 DIAGNOSIS — N920 Excessive and frequent menstruation with regular cycle: Secondary | ICD-10-CM

## 2016-01-02 LAB — CBC WITH DIFFERENTIAL/PLATELET
Basophils Absolute: 0 10*3/uL (ref 0.0–0.1)
Basophils Relative: 0 % (ref 0–1)
EOS PCT: 3 % (ref 0–5)
Eosinophils Absolute: 0.2 10*3/uL (ref 0.0–0.7)
HEMATOCRIT: 31.4 % — AB (ref 36.0–46.0)
HEMOGLOBIN: 9.6 g/dL — AB (ref 12.0–15.0)
LYMPHS ABS: 2.1 10*3/uL (ref 0.7–4.0)
LYMPHS PCT: 36 % (ref 12–46)
MCH: 23.2 pg — ABNORMAL LOW (ref 26.0–34.0)
MCHC: 30.6 g/dL (ref 30.0–36.0)
MCV: 76 fL — AB (ref 78.0–100.0)
MONO ABS: 0.4 10*3/uL (ref 0.1–1.0)
MPV: 9.6 fL (ref 8.6–12.4)
Monocytes Relative: 7 % (ref 3–12)
NEUTROS ABS: 3.1 10*3/uL (ref 1.7–7.7)
Neutrophils Relative %: 54 % (ref 43–77)
Platelets: 517 10*3/uL — ABNORMAL HIGH (ref 150–400)
RBC: 4.13 MIL/uL (ref 3.87–5.11)
RDW: 18.5 % — AB (ref 11.5–15.5)
WBC: 5.8 10*3/uL (ref 4.0–10.5)

## 2016-01-02 MED ORDER — MEGESTROL ACETATE 20 MG PO TABS: 20.0000 mg | ORAL_TABLET | Freq: Four times a day (QID) | ORAL | Status: DC

## 2016-01-02 NOTE — Telephone Encounter (Signed)
Pt post D&C on 12/24/15 c/o heavy bleeding changing pads every hour heavy bleeding. Pt informed to come now as work in, [employer] informed

## 2016-01-02 NOTE — Progress Notes (Signed)
Tabitha White [birth year] [id number]        55 y.o.  G3P3003 Presents postoperatively status post hysteroscopic submucous myomectomy 12/24/2015. Did well until several days ago when she started to bleed and then yesterday passed multiple clots. Today it seems to be getting better.  Past medical history,surgical history, problem list, medications, allergies, family history and social history were all reviewed and documented in the EPIC chart.  Directed ROS with pertinent positives and negatives documented in the history of present illness/assessment and plan.  Exam: [name] assistant Filed Vitals:   01/02/16 0941  BP: 124/78   General appearance:  Normal Abdomen soft nontender without gross masses rebound or guarding Pelvic external BUS vagina with white vaginal staining. Cervix grossly normal. High in vaginal vault. Uterus difficult to palpate but no gross masses or tenderness  Assessment/Plan:  55 y.o. [id number] with history of heavy bleeding over the last day or so but now subsiding. History of heavy menses over the past several years with multiple large myomas. [place] check this past year normal.  We will initiate Megace 20 mg 4 times a day for several days. Assuming bleeding slows and will wean herself down to 20 mg daily and stay on this. Will arrange for Depo-Lupron to hopefully suppress bleeding and allow for hemoglobin recovery. Will check CBC today which I suspect will be lower than her preoperative hemoglobin to 05/2016 of 10.6.  Is on daily iron I suggested to increase this twice daily. Is not having other symptoms such as lightheadedness or dizziness with standing. Hopefully she will suppress over the next several months and will allow for hemoglobin recovery. Longer range options discussed to include maintaining Depo-Lupron for 6 months to year and then reassessing for menopause versus considering surgery.  Uterus is well supported. With BMI of 45 and panniculus consideration for  robotic-assisted hysterectomy discussed. Reviewed possible referral to evaluate for this possibility. Issues and risks of TAH to include wound complications reviewed.  Risks of transfusion if inadequate hemoglobin recovery discussed.  Will readdress after initiating Megace and her Depo-Lupron.    Anastasio Auerbach MD, 10:51 AM 01/02/2016

## 2016-01-02 NOTE — Patient Instructions (Signed)
Start on the Megace 4 times daily. Assuming the bleeding subsides then decrease to once daily and stay on this.  Office will contact you to arrange for the Depo-Lupron.

## 2016-01-07 ENCOUNTER — Ambulatory Visit: Payer: BC Managed Care – PPO | Admitting: Gynecology

## 2016-01-14 ENCOUNTER — Other Ambulatory Visit: Payer: Self-pay | Admitting: Internal Medicine

## 2016-01-14 DIAGNOSIS — Z1231 Encounter for screening mammogram for malignant neoplasm of breast: Secondary | ICD-10-CM

## 2016-01-17 ENCOUNTER — Ambulatory Visit
Admission: RE | Admit: 2016-01-17 | Discharge: 2016-01-17 | Disposition: A | Discharge status: Home / Self Care | Payer: BC Managed Care – PPO | Source: Ambulatory Visit | Attending: Internal Medicine | Admitting: Internal Medicine

## 2016-01-17 DIAGNOSIS — Z1231 Encounter for screening mammogram for malignant neoplasm of breast: Secondary | ICD-10-CM

## 2016-01-30 ENCOUNTER — Telehealth: Payer: Self-pay

## 2016-01-30 NOTE — Telephone Encounter (Signed)
I contacted patient and left detailed voice mail per DPR access note on file.  I received status update today from [facility] regarding patient's Lupron order. They have not shipped it because they are still waiting to talk with patient to confirm shipment. I left her a message relaying this and provided their phone number that they asked that she call. I told her if for some reason too expensive just to tell them not to ship it and then let me know so we can let her provider know.

## 2017-02-11 ENCOUNTER — Other Ambulatory Visit: Payer: Self-pay | Admitting: Gastroenterology

## 2017-04-14 ENCOUNTER — Encounter (HOSPITAL_COMMUNITY): Payer: Self-pay | Admitting: *Deleted

## 2017-04-19 MED ORDER — PROPOFOL 10 MG/ML IV BOLUS
INTRAVENOUS | Status: AC
  Filled 2017-04-19: qty 20

## 2017-04-19 MED ORDER — LIDOCAINE 2% (20 MG/ML) 5 ML SYRINGE
INTRAMUSCULAR | Status: AC
  Filled 2017-04-19: qty 5

## 2017-04-19 MED ORDER — ONDANSETRON HCL 4 MG/2ML IJ SOLN
INTRAMUSCULAR | Status: AC
  Filled 2017-04-19: qty 2

## 2017-05-06 ENCOUNTER — Encounter (HOSPITAL_COMMUNITY): Payer: Self-pay | Admitting: *Deleted

## 2017-05-10 ENCOUNTER — Ambulatory Visit (HOSPITAL_COMMUNITY)
Admission: RE | Admit: 2017-05-10 | Discharge: 2017-05-10 | Disposition: A | Discharge status: Home / Self Care | Payer: BC Managed Care – PPO | Source: Ambulatory Visit | Attending: Gastroenterology | Admitting: Gastroenterology

## 2017-05-10 ENCOUNTER — Encounter (HOSPITAL_COMMUNITY)
Admission: RE | Disposition: A | Discharge status: Home / Self Care | Payer: Self-pay | Source: Ambulatory Visit | Attending: Gastroenterology

## 2017-05-10 HISTORY — DX: Benign neoplasm of connective and other soft tissue, unspecified: D21.9

## 2017-05-10 SURGERY — COLONOSCOPY WITH PROPOFOL
Anesthesia: Monitor Anesthesia Care

## 2018-09-07 ENCOUNTER — Other Ambulatory Visit: Payer: Self-pay | Admitting: Internal Medicine

## 2018-09-07 DIAGNOSIS — Z1231 Encounter for screening mammogram for malignant neoplasm of breast: Secondary | ICD-10-CM

## 2018-09-20 ENCOUNTER — Ambulatory Visit: Payer: BC Managed Care – PPO | Admitting: Gynecology

## 2018-10-21 ENCOUNTER — Ambulatory Visit
Admission: RE | Admit: 2018-10-21 | Discharge: 2018-10-21 | Disposition: A | Discharge status: Home / Self Care | Payer: BC Managed Care – PPO | Source: Ambulatory Visit | Attending: Internal Medicine | Admitting: Internal Medicine

## 2018-10-21 DIAGNOSIS — Z1231 Encounter for screening mammogram for malignant neoplasm of breast: Secondary | ICD-10-CM

## 2018-11-10 ENCOUNTER — Ambulatory Visit: Payer: BC Managed Care – PPO | Admitting: Gynecology

## 2018-11-15 ENCOUNTER — Ambulatory Visit (INDEPENDENT_AMBULATORY_CARE_PROVIDER_SITE_OTHER): Payer: BC Managed Care – PPO | Admitting: Gynecology

## 2018-11-15 ENCOUNTER — Encounter: Payer: Self-pay | Admitting: Gynecology

## 2018-11-15 VITALS — BP 126/82 | Ht 66.0 in | Wt 290.0 lb

## 2018-11-15 DIAGNOSIS — Z01419 Encounter for gynecological examination (general) (routine) without abnormal findings: Secondary | ICD-10-CM

## 2018-11-15 DIAGNOSIS — D259 Leiomyoma of uterus, unspecified: Secondary | ICD-10-CM

## 2018-11-15 NOTE — Addendum Note (Signed)
Addended by: Nelva Nay on: 11/15/2018 10:37 AM   Modules accepted: Orders

## 2018-11-15 NOTE — Patient Instructions (Signed)
Schedule your colonoscopy with either:  [facility]   Address: [address], [place], [place] [address]  [phone]    or  [facility]  Address: [address], [place], [place] [address]  [phone]      

## 2018-11-15 NOTE — Progress Notes (Signed)
    BRINSLEY WENCE [birth year] [id number]        57 y.o.  G3P3003 for annual gynecologic exam.  History of leiomyoma and heavy bleeding.  Has not had a period approaching 2 years.  No significant menopausal symptoms.  Past medical history,surgical history, problem list, medications, allergies, family history and social history were all reviewed and documented as reviewed in the EPIC chart.  ROS:  Performed with pertinent positives and negatives included in the history, assessment and plan.   Additional significant findings : None   Exam: [name] assistant Vitals:   11/15/18 0832  BP: 126/82  Weight: 290 lb (131.5 kg)  Height: 5\' 6"  (1.676 m)   Body mass index is 46.81 kg/m.  General appearance:  Normal affect, orientation and appearance. Skin: Grossly normal HEENT: Without gross lesions.  No cervical or supraclavicular adenopathy. Thyroid normal.  Lungs:  Clear without wheezing, rales or rhonchi Cardiac: RR, without RMG Abdominal:  Soft, nontender, without masses, guarding, rebound, organomegaly or hernia Breasts:  Examined lying and sitting without masses, retractions, discharge or axillary adenopathy. Pelvic:  Ext, BUS, Vagina: Normal with mild atrophic changes  Cervix: Normal.  Pap smear done  Uterus: Unable to palpate but no gross masses or tenderness  Adnexa: Without gross masses or tenderness    Anus and perineum: Normal   Rectovaginal: Normal sphincter tone without palpated masses or tenderness.    Assessment/Plan:  57 y.o. G18P3003 female for annual gynecologic exam.   1. Postmenopausal.  No significant menopausal symptoms or any vaginal bleeding. 2. Pap smear 2016.  Pap smear done today.  No history of significant abnormal Pap smears. 3. Mammography 10/2018.  Continue with annual mammography next year.  Breast exam normal today. 4. Colonoscopy never.  Second most common cancer in women discussed.  Higher incidence in African-American population also  discussed.  Strongly recommended she schedule a screening colonoscopy.  Names and numbers provided. 5. DEXA never.  Will plan further into the menopause. 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  Exam NED. 7. Health maintenance.  Planning appointment with primary physician.  Stressed the need to do so and follow-up on routine blood work to include cholesterol and diabetes screening.  Follow-up in 1 year, sooner as needed.   Anastasio Auerbach MD, 8:57 AM 11/15/2018

## 2018-11-17 LAB — PAP IG W/ RFLX HPV ASCU

## 2018-12-13 ENCOUNTER — Encounter: Payer: Self-pay | Admitting: Gynecology

## 2018-12-13 ENCOUNTER — Ambulatory Visit (INDEPENDENT_AMBULATORY_CARE_PROVIDER_SITE_OTHER): Payer: BC Managed Care – PPO | Admitting: Gynecology

## 2018-12-13 VITALS — BP 130/84

## 2018-12-13 DIAGNOSIS — N921 Excessive and frequent menstruation with irregular cycle: Secondary | ICD-10-CM

## 2018-12-13 MED ORDER — MEGESTROL ACETATE 20 MG PO TABS: 20.0000 mg | ORAL_TABLET | Freq: Two times a day (BID) | ORAL | 1 refills | Status: AC

## 2018-12-13 NOTE — Progress Notes (Signed)
    Tabitha White 03/09/1961 [id number]        58 y.o.  G3P3003 presents having gone more than 2 years without menses or any bleeding and now with 6 days of initially heavy bleeding but starting to taper now.  She has a history of menorrhagia in the past with leiomyoma.  Had submucous myomectomy 2017.  Has done well without bleeding over the last 2 years.  Having cramping now with her bleeding.  Past medical history,surgical history, problem list, medications, allergies, family history and social history were all reviewed and documented in the EPIC chart.  Directed ROS with pertinent positives and negatives documented in the history of present illness/assessment and plan.   Exam: [name] assistant Vitals:   12/13/18 1523  BP: 130/84   General appearance:  Normal Abdomen soft nontender without masses guarding rebound Pelvic external BUS vagina with light menses flow.  Cervix normal.  Uterus difficult to palpate but irregular and bulky consistent with history of leiomyoma.  Adnexa without gross masses or tenderness.  Assessment/Plan:  58 y.o. G3P3003 with episode of bleeding over the past 6 days after 2+ years of no bleeding.  History of leiomyoma with menorrhagia in the past.  History of submucous myoma resection in the past.  Check baseline CBC today.  Megace 20 mg twice daily until bleeding slows then 20 mg daily.  Side effects and risks discussed to include thrombosis.  Schedule sonohysterogram for pelvic surveillance, follow-up on leiomyoma and endometrial assessment.  Patient will schedule in follow-up for this.    Anastasio Auerbach MD, 3:56 PM 12/13/2018

## 2018-12-13 NOTE — Patient Instructions (Signed)
Start on the Megace progesterone medication twice daily until bleeding slows then stay on 1 pill daily.  Follow-up for the ultrasound as scheduled

## 2019-01-03 ENCOUNTER — Telehealth: Payer: Self-pay

## 2019-01-03 NOTE — Telephone Encounter (Signed)
Patient informed. 

## 2019-01-03 NOTE — Telephone Encounter (Addendum)
Patient called in voice mail stating she is scheduled [facility] on 02/03/19 and she has started bleeding.  Upon speaking with her she informs me that it is super light and a light days pad held her all day yesterday. Still light today.  (She has not bled in 2 years prior to this.)  She wonders if this will interfere with her [facility] scheduled.

## 2019-01-03 NOTE — Telephone Encounter (Signed)
Bleeding will not interfere

## 2019-01-05 ENCOUNTER — Other Ambulatory Visit: Payer: Self-pay | Admitting: Gynecology

## 2019-01-05 ENCOUNTER — Encounter: Payer: Self-pay | Admitting: Gynecology

## 2019-01-05 ENCOUNTER — Ambulatory Visit (INDEPENDENT_AMBULATORY_CARE_PROVIDER_SITE_OTHER): Payer: BC Managed Care – PPO | Admitting: Gynecology

## 2019-01-05 ENCOUNTER — Ambulatory Visit: Payer: BC Managed Care – PPO

## 2019-01-05 VITALS — BP 124/84

## 2019-01-05 DIAGNOSIS — N921 Excessive and frequent menstruation with irregular cycle: Secondary | ICD-10-CM | POA: Diagnosis not present

## 2019-01-05 NOTE — Patient Instructions (Signed)
Office will call you with the biopsy results and blood test results

## 2019-01-05 NOTE — Addendum Note (Signed)
Addended by: Nelva Nay on: 01/05/2019 03:37 PM   Modules accepted: Orders

## 2019-01-05 NOTE — Progress Notes (Signed)
    Tabitha White 09-Jun-1961 [id number]        58 y.o.  G3P3003 presents having gone 2 years without menses and then starting to bleed several weeks ago.  She does have a history of menorrhagia in the past with leiomyoma.  Status post submucous myomectomy 2017.  Had done well since then with no bleeding.  Past medical history,surgical history, problem list, medications, allergies, family history and social history were all reviewed and documented in the EPIC chart.  Directed ROS with pertinent positives and negatives documented in the history of present illness/assessment and plan.  Exam: Tabitha White assistant General appearance:  Normal Abdomen soft nontender without masses guarding rebound Pelvic external BUS vagina with menses type flow.  Cervix normal.  Uterus difficult to palpate.  Ultrasound transvaginal and transabdominal shows uterus enlarged at 164 mm x 104 mm x 86 mm.  Endometrial echo 8.3 mm with fluid and debris noted.  (Patiently actively bleeding during exam).  Multiple myomas noted approximately 6 with the largest measuring 63 x 59 mm and 52 x 48 mm.  Both ovaries visualized and grossly normal.  Small probable hydrosalpinx on the right measuring 64 x 20 mm unchanged from prior ultrasound.  Small introital submucosal cyst also noted at 23 x 17 mm.  Sonohysterogram performed, sterile technique, easy catheter introduction, good distention with several cavitary defects suggesting endometrial polyps x2.  Endometrial sample taken.  Patient tolerated well.  Assessment/Plan:  58 y.o. [id number] with history of leiomyoma.  Had hysteroscopic submucous myomectomy and subsequently became amenorrheic until most recently.  Ultrasound shows several small defects either small polyps versus clot.  Endometrial biopsy was taken.  Patient is going to check baseline CBC as she did not have this done last visit as ordered and [place] to see where we stand from a menopausal standpoint.  Patient will follow-up  for the biopsy results.  Various scenarios were reviewed to include hormonal suppression such as progesterone only or Depo-Lupron, hysteroscopy with resection of any intracavitary defects, uterine artery embolization, hysterectomy either robotic or abdominal.  We have discussed her abdominal girth and risks of wound complications with hysterectomy.  Patient will follow-up for her biopsy results and CBC/FSH and then will go from there.    Tabitha Auerbach MD, 2:54 PM 01/05/2019

## 2019-01-06 LAB — CBC WITH DIFFERENTIAL/PLATELET
ABSOLUTE MONOCYTES: 484 {cells}/uL (ref 200–950)
Basophils Absolute: 22 cells/uL (ref 0–200)
Basophils Relative: 0.4 %
EOS ABS: 171 {cells}/uL (ref 15–500)
EOS PCT: 3.1 %
HEMATOCRIT: 39 % (ref 35.0–45.0)
Hemoglobin: 12.8 g/dL (ref 11.7–15.5)
LYMPHS ABS: 2393 {cells}/uL (ref 850–3900)
MCH: 27.9 pg (ref 27.0–33.0)
MCHC: 32.8 g/dL (ref 32.0–36.0)
MCV: 85 fL (ref 80.0–100.0)
MONOS PCT: 8.8 %
MPV: 11.4 fL (ref 7.5–12.5)
NEUTROS PCT: 44.2 %
Neutro Abs: 2431 cells/uL (ref 1500–7800)
PLATELETS: 282 10*3/uL (ref 140–400)
RBC: 4.59 10*6/uL (ref 3.80–5.10)
RDW: 13.6 % (ref 11.0–15.0)
TOTAL LYMPHOCYTE: 43.5 %
WBC: 5.5 10*3/uL (ref 3.8–10.8)

## 2019-01-06 LAB — FOLLICLE STIMULATING HORMONE: FSH: 12.6 m[IU]/mL

## 2019-01-11 ENCOUNTER — Telehealth: Payer: Self-pay

## 2019-01-11 NOTE — Telephone Encounter (Signed)
Opened in error. Wanted to attach t.c. to result note.

## 2019-01-11 NOTE — Telephone Encounter (Addendum)
I read Dr. Dorette Grate reply to patient. She pondered it a minute or two but then decided she will proceed as Dr. Loetta Rough recommended with D&C Hysteroscopy.  Patient called back and wanted to know is there not a doctor in [place] you can refer her to for a Robotic Hysterectomy without her having to travel to [place] for surgery?

## 2019-01-11 NOTE — Telephone Encounter (Signed)
-----   Message from Anastasio Auerbach, MD sent at 01/10/2019  8:35 AM EST ----- Tell patient the biopsy from the ultrasound showed lower uterine tissue but no upper uterine tissue.  It just showed a lot of blood and clot.  Her hormone level actually returned normal.  My recommendation would be to proceed with hysteroscopy D&C to clean out the endometrial cavity and see if this does not address the bleeding.  It will also allow me to sample the upper uterine tissue and assess for any polyps.  If she agrees with this then let me know and I will start arranging this.  If she has any questions or wants to further discuss then recommend office visit.

## 2019-01-11 NOTE — Telephone Encounter (Signed)
I called patient and read her your result note.  Patient said she is ready for hysterectomy.  She said she did D&C, Hyst just two years ago and now having to do it again. She said she is tired of never knowing when she will bleed.

## 2019-01-11 NOTE — Telephone Encounter (Signed)
At age 58, recognizing that once she goes through menopause fibroids tend to shrink and with her increased weight increasing the risk of surgery I am not sure hysterectomy is the right choice.  If she would consider hysterectomy then I think a robotic approach would avoid a large incision on the abdomen which would be a major complication if she had a wound infection or complication after.  If she would like to consider hysterectomy then I would recommend referral to [facility] to consider robotic hysterectomy.  Otherwise I would still recommend proceeding with hysteroscopy D&C to assess the cavity and hopefully cleared out to ease her bleeding.

## 2019-01-12 ENCOUNTER — Telehealth: Payer: Self-pay | Admitting: *Deleted

## 2019-01-12 ENCOUNTER — Telehealth: Payer: Self-pay

## 2019-01-12 ENCOUNTER — Ambulatory Visit: Payer: BC Managed Care – PPO | Admitting: Gynecology

## 2019-01-12 ENCOUNTER — Other Ambulatory Visit: Payer: BC Managed Care – PPO

## 2019-01-12 ENCOUNTER — Encounter: Payer: Self-pay | Admitting: Gynecology

## 2019-01-12 MED ORDER — MISOPROSTOL 100 MCG PO TABS: ORAL_TABLET | ORAL | 0 refills | Status: AC

## 2019-01-12 NOTE — Telephone Encounter (Signed)
I spoke with patient and advised her regarding need for Cytotec tab intravaginally night before surgery. I warned her she may have spotting, light bleeding or cramping overnight that night but no worries, just means it is working. Rx sent.

## 2019-01-12 NOTE — Telephone Encounter (Signed)
Patient called to say she wanted to cancel scheduled surgery for March, states she will call back at a later date to schedule. I asked her twice if this is what she would like to do and she said yes. I will cancel pre-op appointment will route to Sharrie Rothman to cancel surgery.

## 2019-01-12 NOTE — Telephone Encounter (Signed)
I think her hysterectomy would be challenging and that having it at a [facility] would be a better choice

## 2019-01-12 NOTE — Telephone Encounter (Signed)
Spoke with patient again. She wants to proceed with D&C, Hysteroscopy.  Please send me your surgery order. Thanks

## 2019-01-12 NOTE — Telephone Encounter (Signed)
Left message to call me.

## 2019-01-12 NOTE — Telephone Encounter (Signed)
Patient called back.  I instructed her regarding need for Cytotec tab intravaginally hs before surgery. Warned her she might see spotting/light bleeding/cramping overnight with this but no worries just means it is working. Rx Sent.

## 2019-01-12 NOTE — Telephone Encounter (Signed)
I spoke with patient and reviewed ins benefits and estimated surgery prepymt due by one week before surgery. We discussed dates and I scheduled her for 02/07/19 at 11:00am at [facility].  Pre op appt scheduled as well.

## 2019-01-12 NOTE — Telephone Encounter (Addendum)
I called patient back and left message to call. I need to instruct her regarding Cytotec.

## 2019-01-16 NOTE — Telephone Encounter (Signed)
Surgery cancelled w/ Kim at [phone] at centralized scheduling. KW CMA

## 2019-01-31 ENCOUNTER — Ambulatory Visit: Payer: BC Managed Care – PPO | Admitting: Gynecology

## 2019-02-07 ENCOUNTER — Encounter (HOSPITAL_BASED_OUTPATIENT_CLINIC_OR_DEPARTMENT_OTHER): Payer: Self-pay

## 2019-02-07 ENCOUNTER — Ambulatory Visit (HOSPITAL_BASED_OUTPATIENT_CLINIC_OR_DEPARTMENT_OTHER): Admit: 2019-02-07 | Payer: BC Managed Care – PPO | Admitting: Gynecology

## 2019-02-07 SURGERY — DILATATION & CURETTAGE/HYSTEROSCOPY WITH MYOSURE
Anesthesia: General

## 2019-08-15 ENCOUNTER — Encounter: Payer: Self-pay | Admitting: Gynecology

## 2019-11-21 ENCOUNTER — Other Ambulatory Visit: Payer: Self-pay | Admitting: Internal Medicine

## 2019-11-21 DIAGNOSIS — Z9289 Personal history of other medical treatment: Secondary | ICD-10-CM

## 2019-11-29 ENCOUNTER — Other Ambulatory Visit: Payer: Self-pay | Admitting: Obstetrics and Gynecology

## 2019-11-29 DIAGNOSIS — M8580 Other specified disorders of bone density and structure, unspecified site: Secondary | ICD-10-CM

## 2019-12-29 ENCOUNTER — Ambulatory Visit: Payer: BC Managed Care – PPO

## 2020-02-05 ENCOUNTER — Ambulatory Visit: Payer: BC Managed Care – PPO | Attending: Internal Medicine

## 2020-02-05 DIAGNOSIS — Z23 Encounter for immunization: Secondary | ICD-10-CM

## 2020-02-05 NOTE — Progress Notes (Signed)
   Covid-19 Vaccination Clinic  Name:  ROCKELLE KUPERUS    MRN: [id number] DOB: Mar 28, 1961  02/05/2020  Ms. Eversman was observed post Covid-19 immunization for 15 minutes without incident. She was provided with Vaccine Information Sheet and instruction to access the V-Safe system.   Ms. Glassberg was instructed to call 911 with any severe reactions post vaccine: Marland Kitchen Difficulty breathing  . Swelling of face and throat  . A fast heartbeat  . A bad rash all over body  . Dizziness and weakness   Immunizations Administered    Name Date Dose VIS Date Route   Pfizer COVID-19 Vaccine 02/05/2020 11:50 AM 0.3 mL 10/27/2019 Intramuscular   Manufacturer: [place]   Lot: [id number]   [place]: [id number]

## 2020-02-12 ENCOUNTER — Ambulatory Visit
Admission: RE | Admit: 2020-02-12 | Discharge: 2020-02-12 | Disposition: A | Discharge status: Home / Self Care | Payer: BC Managed Care – PPO | Source: Ambulatory Visit | Attending: Obstetrics and Gynecology | Admitting: Obstetrics and Gynecology

## 2020-02-12 ENCOUNTER — Ambulatory Visit
Admission: RE | Admit: 2020-02-12 | Discharge: 2020-02-12 | Disposition: A | Discharge status: Home / Self Care | Payer: BC Managed Care – PPO | Source: Ambulatory Visit | Attending: Internal Medicine | Admitting: Internal Medicine

## 2020-02-12 ENCOUNTER — Other Ambulatory Visit: Payer: Self-pay

## 2020-02-12 DIAGNOSIS — M8580 Other specified disorders of bone density and structure, unspecified site: Secondary | ICD-10-CM

## 2020-02-12 DIAGNOSIS — Z9289 Personal history of other medical treatment: Secondary | ICD-10-CM

## 2020-02-28 ENCOUNTER — Ambulatory Visit: Payer: BC Managed Care – PPO | Attending: Internal Medicine

## 2020-02-28 DIAGNOSIS — Z23 Encounter for immunization: Secondary | ICD-10-CM

## 2020-02-28 NOTE — Progress Notes (Signed)
   Covid-19 Vaccination Clinic  Name:  Tabitha White    MRN: [id number] DOB: 03/15/1961  02/28/2020  [name] was observed post Covid-19 immunization for 15 minutes without incident. She was provided with Vaccine Information Sheet and instruction to access the V-Safe system.   [name] was instructed to call 911 with any severe reactions post vaccine: Marland Kitchen Difficulty breathing  . Swelling of face and throat  . A fast heartbeat  . A bad rash all over body  . Dizziness and weakness   Immunizations Administered    Name Date Dose VIS Date Route   Pfizer COVID-19 Vaccine 02/28/2020 12:06 PM 0.3 mL 10/27/2019 Intramuscular   Manufacturer: [place]   Lot: [id number]   [place]: [id number]

## 2021-01-10 ENCOUNTER — Other Ambulatory Visit: Payer: Self-pay | Admitting: Internal Medicine

## 2021-01-10 DIAGNOSIS — Z1231 Encounter for screening mammogram for malignant neoplasm of breast: Secondary | ICD-10-CM

## 2021-03-07 ENCOUNTER — Other Ambulatory Visit: Payer: Self-pay

## 2021-03-07 ENCOUNTER — Ambulatory Visit
Admission: RE | Admit: 2021-03-07 | Discharge: 2021-03-07 | Disposition: A | Discharge status: Home / Self Care | Payer: BC Managed Care – PPO | Source: Ambulatory Visit | Attending: Internal Medicine | Admitting: Internal Medicine

## 2021-03-07 DIAGNOSIS — Z1231 Encounter for screening mammogram for malignant neoplasm of breast: Secondary | ICD-10-CM

## 2021-12-07 IMAGING — MG MM DIGITAL SCREENING BILAT W/ TOMO AND CAD
6 of 10 series · 6 of 30 positions shown · non-contrast
Comparison: Previous exam(s).

CLINICAL DATA: Screening.

EXAM:
DIGITAL SCREENING BILATERAL MAMMOGRAM WITH TOMOSYNTHESIS AND CAD
TECHNIQUE: Bilateral screening digital craniocaudal and mediolateral oblique
mammograms were obtained. Bilateral screening digital breast
tomosynthesis was performed. The images were evaluated with
computer-aided detection.

[R CC synth-2D]
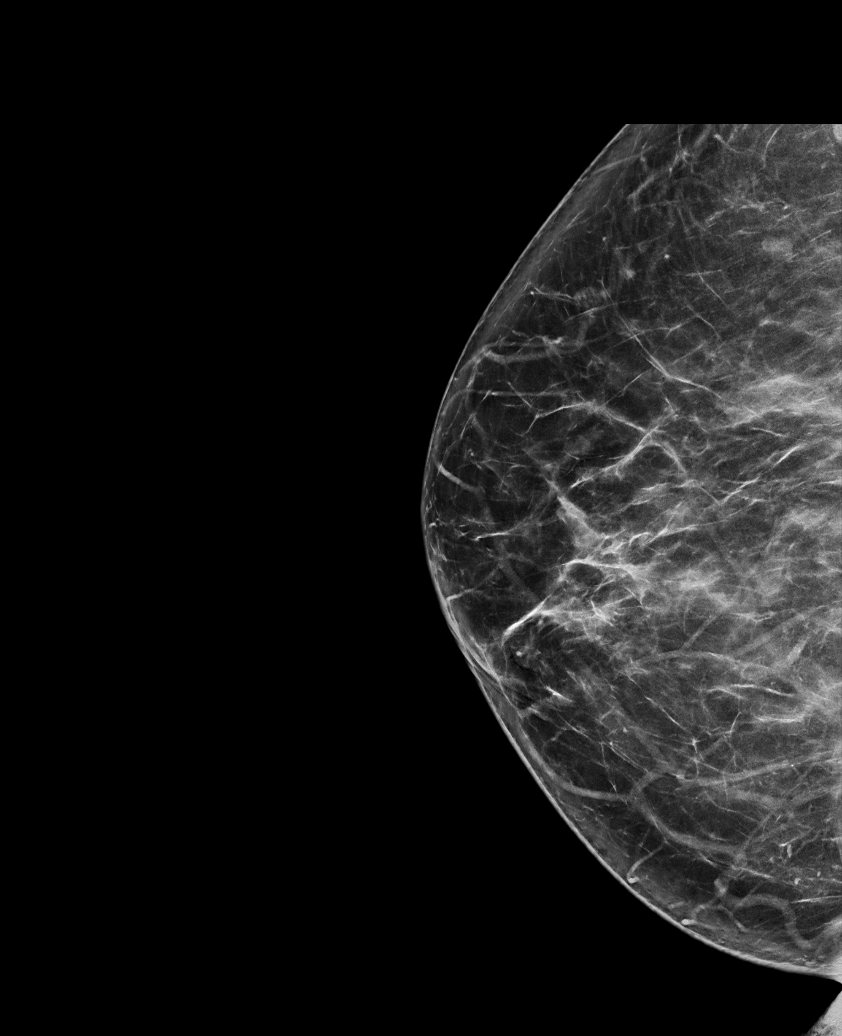

[L MLO synth-2D (1 of 2)]
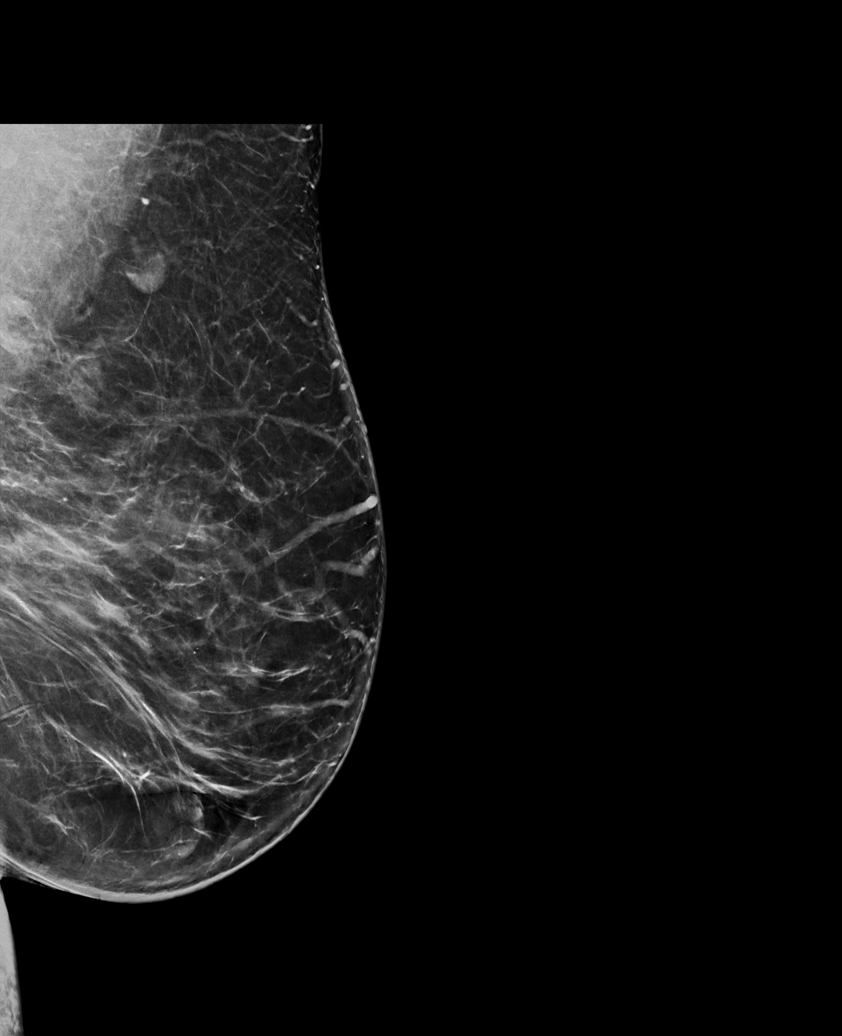

[L MLO synth-2D (2 of 2)]
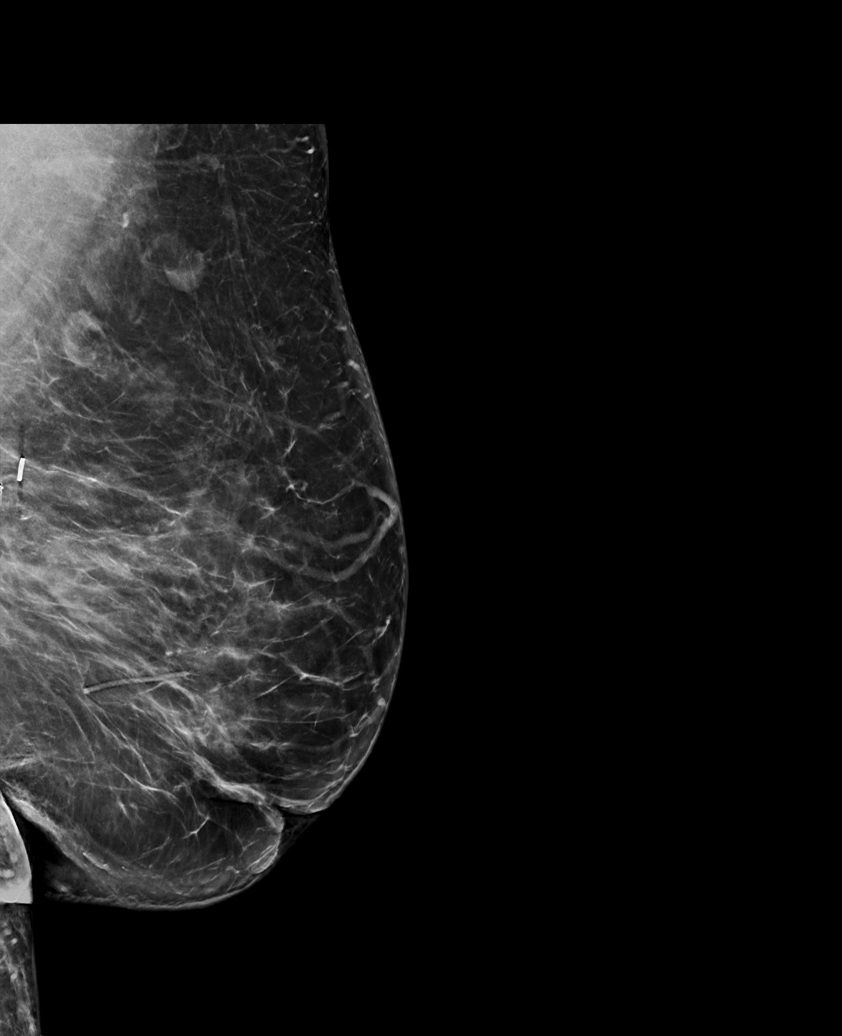

[R MLO synth-2D]
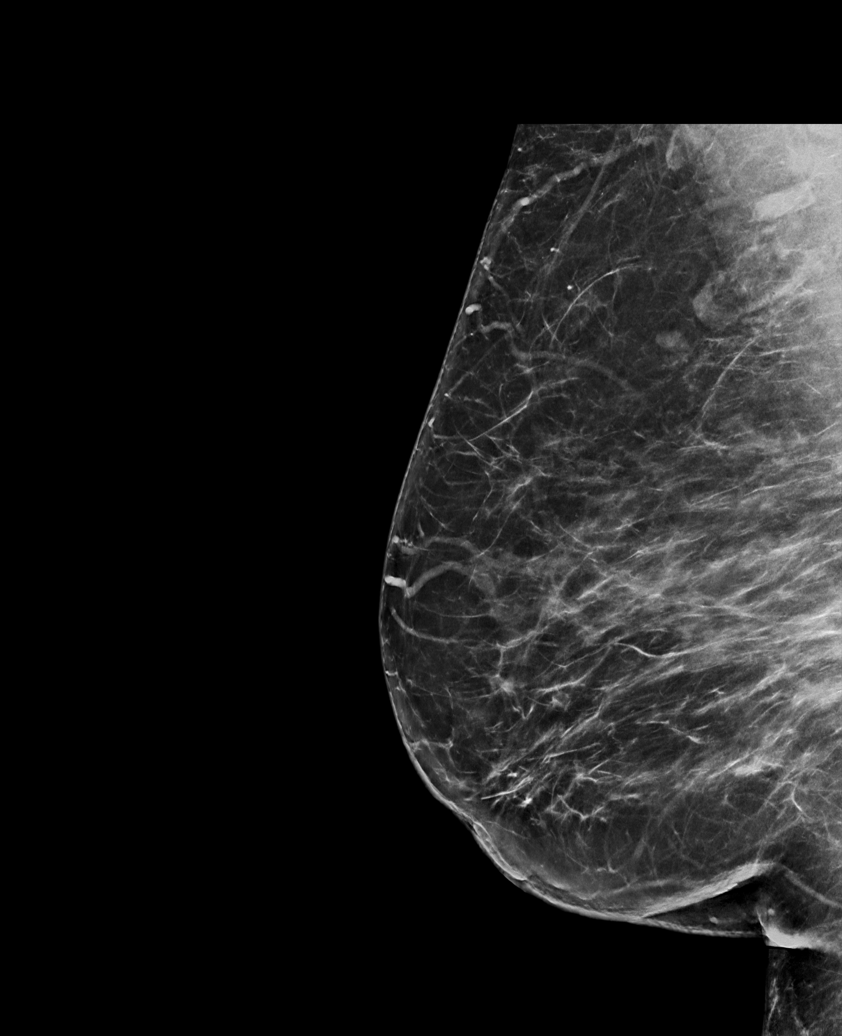

[L CC synth-2D]
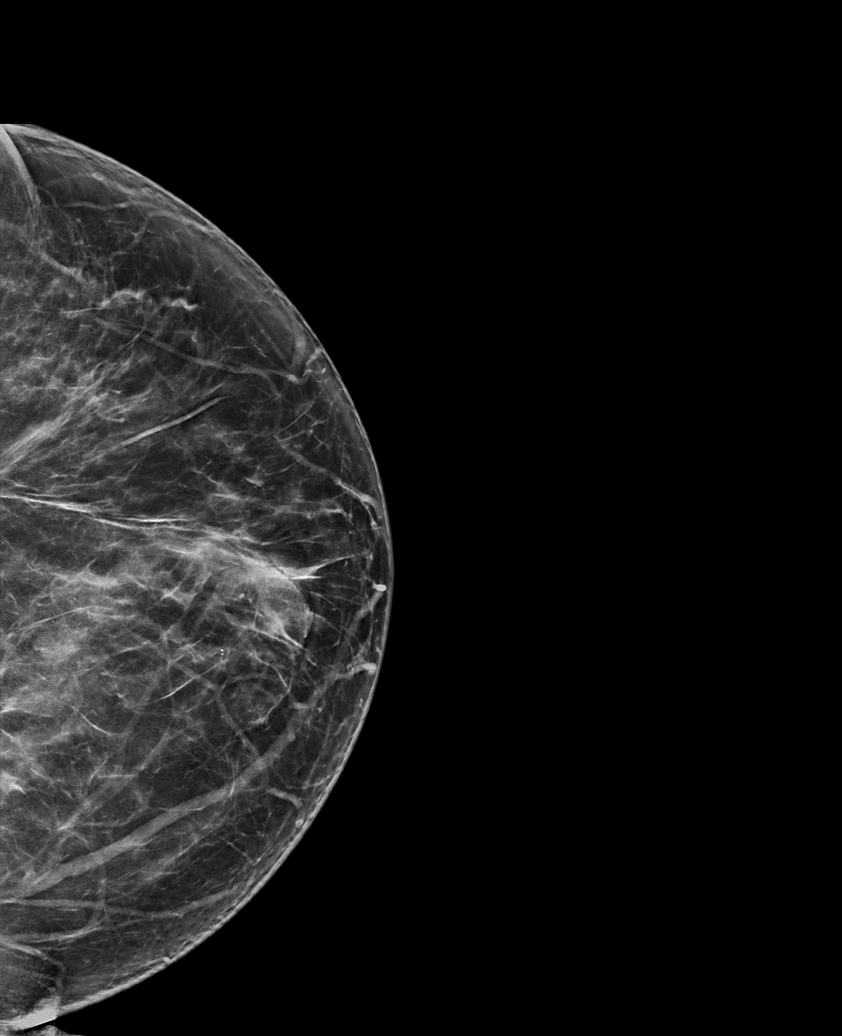

[R CC tomo · tomo slice 41/80.0]
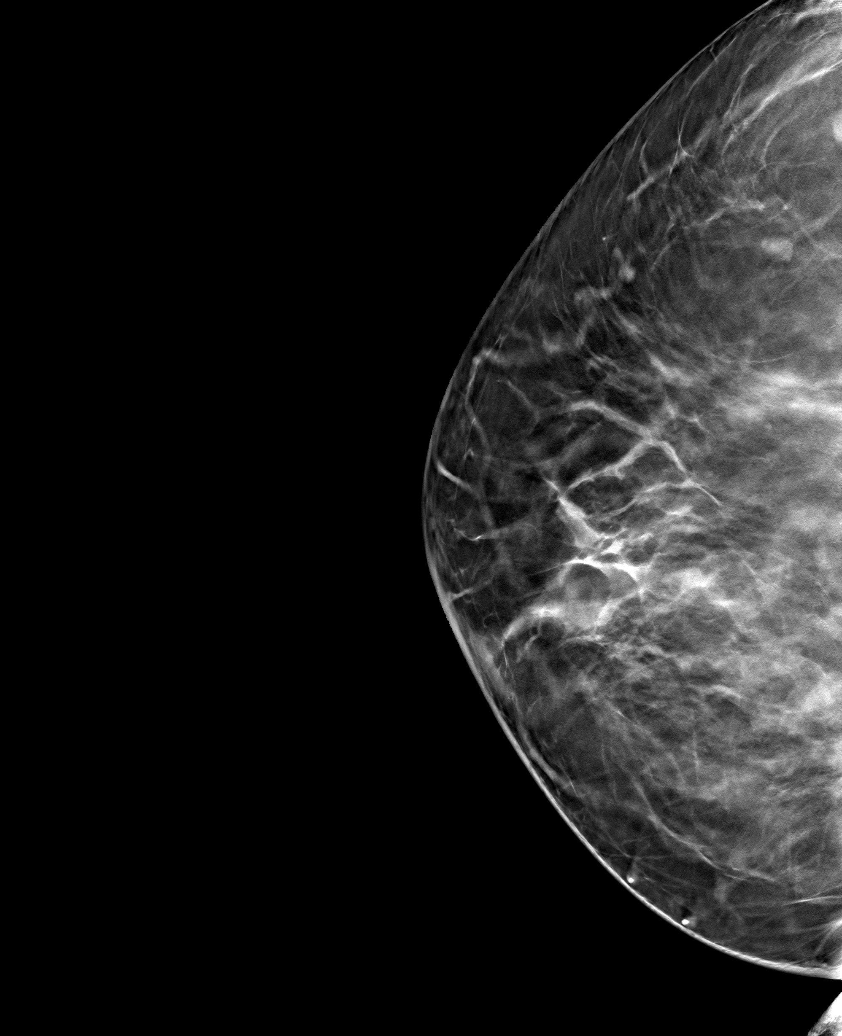

[6 of 30 positions shown; findings below may reference images not displayed]

ACR Breast Density Category b: There are scattered areas of
fibroglandular density.
FINDINGS: There are no findings suspicious for malignancy. The images were
evaluated with computer-aided detection.
IMPRESSION: No mammographic evidence of malignancy. A result letter of this
screening mammogram will be mailed directly to the patient.

RECOMMENDATION:
Screening mammogram in one year. (Code:WJ-I-BG6)

BI-RADS CATEGORY  1: Negative.

## 2022-01-28 ENCOUNTER — Other Ambulatory Visit: Payer: Self-pay | Admitting: Internal Medicine

## 2022-01-28 DIAGNOSIS — Z1231 Encounter for screening mammogram for malignant neoplasm of breast: Secondary | ICD-10-CM

## 2022-03-09 ENCOUNTER — Ambulatory Visit
Admission: RE | Admit: 2022-03-09 | Discharge: 2022-03-09 | Disposition: A | Discharge status: Home / Self Care | Payer: BC Managed Care – PPO | Source: Ambulatory Visit | Attending: Internal Medicine | Admitting: Internal Medicine

## 2022-03-09 DIAGNOSIS — Z1231 Encounter for screening mammogram for malignant neoplasm of breast: Secondary | ICD-10-CM

## 2023-03-26 ENCOUNTER — Other Ambulatory Visit: Payer: Self-pay | Admitting: Internal Medicine

## 2023-03-26 DIAGNOSIS — Z1231 Encounter for screening mammogram for malignant neoplasm of breast: Secondary | ICD-10-CM

## 2023-04-09 ENCOUNTER — Ambulatory Visit
Admission: RE | Admit: 2023-04-09 | Discharge: 2023-04-09 | Disposition: A | Discharge status: Home / Self Care | Payer: BC Managed Care – PPO | Source: Ambulatory Visit | Attending: Internal Medicine | Admitting: Internal Medicine

## 2023-04-09 DIAGNOSIS — Z1231 Encounter for screening mammogram for malignant neoplasm of breast: Secondary | ICD-10-CM

## 2024-03-02 ENCOUNTER — Other Ambulatory Visit: Payer: Self-pay | Admitting: Internal Medicine

## 2024-03-02 DIAGNOSIS — Z1231 Encounter for screening mammogram for malignant neoplasm of breast: Secondary | ICD-10-CM

## 2024-04-11 ENCOUNTER — Ambulatory Visit
Admission: RE | Admit: 2024-04-11 | Discharge: 2024-04-11 | Disposition: A | Discharge status: Home / Self Care | Payer: Self-pay | Source: Ambulatory Visit | Attending: Internal Medicine | Admitting: Internal Medicine

## 2024-04-11 DIAGNOSIS — Z1231 Encounter for screening mammogram for malignant neoplasm of breast: Secondary | ICD-10-CM
